# Patient Record
Sex: Female | Born: 1945 | Race: Black or African American | Hispanic: No | Marital: Single | Smoking: Former smoker
Health system: Southern US, Community
[De-identification: ages and names within clinical notes are randomized; demographics above are authoritative.]

## PROBLEM LIST (undated history)

## (undated) DIAGNOSIS — R002 Palpitations: Secondary | ICD-10-CM

## (undated) DIAGNOSIS — M810 Age-related osteoporosis without current pathological fracture: Secondary | ICD-10-CM

## (undated) DIAGNOSIS — R079 Chest pain, unspecified: Secondary | ICD-10-CM

## (undated) DIAGNOSIS — K219 Gastro-esophageal reflux disease without esophagitis: Secondary | ICD-10-CM

## (undated) DIAGNOSIS — R0609 Other forms of dyspnea: Secondary | ICD-10-CM

## (undated) DIAGNOSIS — M1990 Unspecified osteoarthritis, unspecified site: Secondary | ICD-10-CM

## (undated) DIAGNOSIS — E7800 Pure hypercholesterolemia, unspecified: Secondary | ICD-10-CM

## (undated) HISTORY — DX: Palpitations: R00.2

## (undated) HISTORY — DX: Unspecified osteoarthritis, unspecified site: M19.90

## (undated) HISTORY — PX: HERNIA REPAIR: SHX51

## (undated) HISTORY — DX: Gastro-esophageal reflux disease without esophagitis: K21.9

## (undated) HISTORY — DX: Other forms of dyspnea: R06.09

## (undated) HISTORY — DX: Pure hypercholesterolemia, unspecified: E78.00

## (undated) HISTORY — PX: VAGINAL HYSTERECTOMY: SHX2639

## (undated) HISTORY — DX: Age-related osteoporosis without current pathological fracture: M81.0

## (undated) HISTORY — DX: Chest pain, unspecified: R07.9

## (undated) HISTORY — PX: TONSILLECTOMY: SHX5217

---

## 2009-10-19 ENCOUNTER — Encounter: Payer: Self-pay | Admitting: Cardiovascular Disease

## 2009-10-19 HISTORY — PX: CARDIOVASCULAR STRESS TEST: SHX262

## 2009-12-28 ENCOUNTER — Encounter: Admission: RE | Admit: 2009-12-28 | Discharge: 2009-12-28 | Payer: Self-pay | Admitting: Family Medicine

## 2010-01-10 ENCOUNTER — Encounter: Admission: RE | Admit: 2010-01-10 | Discharge: 2010-01-10 | Payer: Self-pay | Admitting: Family Medicine

## 2012-01-17 DIAGNOSIS — E559 Vitamin D deficiency, unspecified: Secondary | ICD-10-CM | POA: Diagnosis not present

## 2012-01-17 DIAGNOSIS — K219 Gastro-esophageal reflux disease without esophagitis: Secondary | ICD-10-CM | POA: Diagnosis not present

## 2012-01-17 DIAGNOSIS — M810 Age-related osteoporosis without current pathological fracture: Secondary | ICD-10-CM | POA: Diagnosis not present

## 2012-01-17 DIAGNOSIS — Z79899 Other long term (current) drug therapy: Secondary | ICD-10-CM | POA: Diagnosis not present

## 2012-01-17 DIAGNOSIS — S83429A Sprain of lateral collateral ligament of unspecified knee, initial encounter: Secondary | ICD-10-CM | POA: Diagnosis not present

## 2012-01-17 DIAGNOSIS — M129 Arthropathy, unspecified: Secondary | ICD-10-CM | POA: Diagnosis not present

## 2012-01-17 DIAGNOSIS — J309 Allergic rhinitis, unspecified: Secondary | ICD-10-CM | POA: Diagnosis not present

## 2012-02-01 DIAGNOSIS — M810 Age-related osteoporosis without current pathological fracture: Secondary | ICD-10-CM | POA: Diagnosis not present

## 2012-02-01 DIAGNOSIS — Z1231 Encounter for screening mammogram for malignant neoplasm of breast: Secondary | ICD-10-CM | POA: Diagnosis not present

## 2012-03-25 DIAGNOSIS — E162 Hypoglycemia, unspecified: Secondary | ICD-10-CM | POA: Diagnosis not present

## 2012-03-25 DIAGNOSIS — S20219A Contusion of unspecified front wall of thorax, initial encounter: Secondary | ICD-10-CM | POA: Diagnosis not present

## 2012-03-25 DIAGNOSIS — R51 Headache: Secondary | ICD-10-CM | POA: Diagnosis not present

## 2012-03-25 DIAGNOSIS — F29 Unspecified psychosis not due to a substance or known physiological condition: Secondary | ICD-10-CM | POA: Diagnosis not present

## 2012-03-26 DIAGNOSIS — E162 Hypoglycemia, unspecified: Secondary | ICD-10-CM | POA: Diagnosis not present

## 2012-03-26 DIAGNOSIS — R51 Headache: Secondary | ICD-10-CM | POA: Diagnosis not present

## 2012-03-26 DIAGNOSIS — R413 Other amnesia: Secondary | ICD-10-CM | POA: Diagnosis not present

## 2012-03-27 DIAGNOSIS — H40009 Preglaucoma, unspecified, unspecified eye: Secondary | ICD-10-CM | POA: Diagnosis not present

## 2012-04-03 DIAGNOSIS — R51 Headache: Secondary | ICD-10-CM | POA: Diagnosis not present

## 2012-04-03 DIAGNOSIS — F29 Unspecified psychosis not due to a substance or known physiological condition: Secondary | ICD-10-CM | POA: Diagnosis not present

## 2012-04-24 DIAGNOSIS — R51 Headache: Secondary | ICD-10-CM | POA: Diagnosis not present

## 2012-04-24 DIAGNOSIS — E162 Hypoglycemia, unspecified: Secondary | ICD-10-CM | POA: Diagnosis not present

## 2012-04-24 DIAGNOSIS — R413 Other amnesia: Secondary | ICD-10-CM | POA: Diagnosis not present

## 2012-04-24 DIAGNOSIS — M129 Arthropathy, unspecified: Secondary | ICD-10-CM | POA: Diagnosis not present

## 2012-06-05 ENCOUNTER — Encounter: Payer: Self-pay | Admitting: *Deleted

## 2012-08-27 DIAGNOSIS — R413 Other amnesia: Secondary | ICD-10-CM | POA: Diagnosis not present

## 2012-08-27 DIAGNOSIS — E162 Hypoglycemia, unspecified: Secondary | ICD-10-CM | POA: Diagnosis not present

## 2012-08-27 DIAGNOSIS — M129 Arthropathy, unspecified: Secondary | ICD-10-CM | POA: Diagnosis not present

## 2012-08-27 DIAGNOSIS — K219 Gastro-esophageal reflux disease without esophagitis: Secondary | ICD-10-CM | POA: Diagnosis not present

## 2012-12-29 DIAGNOSIS — Z23 Encounter for immunization: Secondary | ICD-10-CM | POA: Diagnosis not present

## 2012-12-29 DIAGNOSIS — M129 Arthropathy, unspecified: Secondary | ICD-10-CM | POA: Diagnosis not present

## 2012-12-29 DIAGNOSIS — E7800 Pure hypercholesterolemia, unspecified: Secondary | ICD-10-CM | POA: Diagnosis not present

## 2012-12-29 DIAGNOSIS — J309 Allergic rhinitis, unspecified: Secondary | ICD-10-CM | POA: Diagnosis not present

## 2012-12-29 DIAGNOSIS — E162 Hypoglycemia, unspecified: Secondary | ICD-10-CM | POA: Diagnosis not present

## 2012-12-29 DIAGNOSIS — F0390 Unspecified dementia without behavioral disturbance: Secondary | ICD-10-CM | POA: Diagnosis not present

## 2013-03-11 DIAGNOSIS — Z0000 Encounter for general adult medical examination without abnormal findings: Secondary | ICD-10-CM | POA: Diagnosis not present

## 2013-03-11 DIAGNOSIS — F17200 Nicotine dependence, unspecified, uncomplicated: Secondary | ICD-10-CM | POA: Diagnosis not present

## 2013-03-11 DIAGNOSIS — Z23 Encounter for immunization: Secondary | ICD-10-CM | POA: Diagnosis not present

## 2013-03-11 DIAGNOSIS — Z1331 Encounter for screening for depression: Secondary | ICD-10-CM | POA: Diagnosis not present

## 2013-03-17 DIAGNOSIS — Z1231 Encounter for screening mammogram for malignant neoplasm of breast: Secondary | ICD-10-CM | POA: Diagnosis not present

## 2013-03-20 DIAGNOSIS — Z1212 Encounter for screening for malignant neoplasm of rectum: Secondary | ICD-10-CM | POA: Diagnosis not present

## 2013-05-05 DIAGNOSIS — R42 Dizziness and giddiness: Secondary | ICD-10-CM | POA: Diagnosis not present

## 2013-05-05 DIAGNOSIS — F0390 Unspecified dementia without behavioral disturbance: Secondary | ICD-10-CM | POA: Diagnosis not present

## 2013-05-05 DIAGNOSIS — R002 Palpitations: Secondary | ICD-10-CM | POA: Diagnosis not present

## 2013-05-05 DIAGNOSIS — R079 Chest pain, unspecified: Secondary | ICD-10-CM | POA: Diagnosis not present

## 2013-05-06 ENCOUNTER — Ambulatory Visit (INDEPENDENT_AMBULATORY_CARE_PROVIDER_SITE_OTHER): Payer: Medicare Other | Admitting: Internal Medicine

## 2013-05-06 ENCOUNTER — Encounter: Payer: Self-pay | Admitting: Internal Medicine

## 2013-05-06 VITALS — BP 118/64 | HR 67 | Ht 67.0 in | Wt 129.0 lb

## 2013-05-06 DIAGNOSIS — R0609 Other forms of dyspnea: Secondary | ICD-10-CM | POA: Diagnosis not present

## 2013-05-06 DIAGNOSIS — Z720 Tobacco use: Secondary | ICD-10-CM | POA: Insufficient documentation

## 2013-05-06 DIAGNOSIS — R079 Chest pain, unspecified: Secondary | ICD-10-CM | POA: Diagnosis not present

## 2013-05-06 DIAGNOSIS — F17200 Nicotine dependence, unspecified, uncomplicated: Secondary | ICD-10-CM

## 2013-05-06 DIAGNOSIS — R002 Palpitations: Secondary | ICD-10-CM | POA: Diagnosis not present

## 2013-05-06 DIAGNOSIS — R9431 Abnormal electrocardiogram [ECG] [EKG]: Secondary | ICD-10-CM

## 2013-05-06 DIAGNOSIS — R0989 Other specified symptoms and signs involving the circulatory and respiratory systems: Secondary | ICD-10-CM | POA: Diagnosis not present

## 2013-05-06 DIAGNOSIS — R0600 Dyspnea, unspecified: Secondary | ICD-10-CM

## 2013-05-06 HISTORY — DX: Dyspnea, unspecified: R06.00

## 2013-05-06 HISTORY — DX: Chest pain, unspecified: R07.9

## 2013-05-06 HISTORY — DX: Other forms of dyspnea: R06.09

## 2013-05-06 HISTORY — DX: Palpitations: R00.2

## 2013-05-06 MED ORDER — ASPIRIN EC 81 MG PO TBEC: 81.0000 mg | DELAYED_RELEASE_TABLET | Freq: Every day | ORAL | Status: DC

## 2013-05-06 NOTE — Assessment & Plan Note (Signed)
The patient had prolonged chest pain syndrome associated with shortness of breath. There also accompanying tachypalpitations. Her ECG suggests prior inferior wall MI and she has ST segment elevation in lead V1 and V2. These abnormalities however were somewhat present in 2010 at which time a stress test was apparently unrevealing.  The prolonged nature of her symptoms is concerning for cardiac event not withstanding. She is largely asymptomatic over the last 72 hours. Hence I think we have a couple of options. One is cardiac catheterization with inpatient evaluation, the second would be expeditious Myoview scanning with event recording looking for the cause of palpitations. She would prefer the latter.  Another possible explanation would be that this is a primary arrhythmia. There is variability in heart rate associated with exertion and with her age atrial fibrillation would be a strong contender not withstanding the fact that she describes the heart rate as  regular
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
## 2013-05-06 NOTE — Patient Instructions (Addendum)
Your physician recommends that you schedule a follow-up appointment as needed   Your physician has requested that you have a lexiscan myoview. For further information please visit [email or web address] Please follow instruction sheet, as given.

## 2013-05-06 NOTE — Assessment & Plan Note (Signed)
As above.

## 2013-05-06 NOTE — Assessment & Plan Note (Signed)
Bi atrial enlargement noted  Will need echo to assess structure and function.  Will try and coordinate tomorrow when she comes for her sterss test

## 2013-05-06 NOTE — Assessment & Plan Note (Signed)
She says she is for of smoking as of 4 days ago. HOORAY

## 2013-05-07 ENCOUNTER — Ambulatory Visit (HOSPITAL_COMMUNITY): Payer: Medicare Other | Attending: Cardiology | Admitting: Radiology

## 2013-05-07 VITALS — BP 132/98 | HR 56 | Ht 67.0 in | Wt 130.0 lb

## 2013-05-07 DIAGNOSIS — R002 Palpitations: Secondary | ICD-10-CM | POA: Insufficient documentation

## 2013-05-07 DIAGNOSIS — R42 Dizziness and giddiness: Secondary | ICD-10-CM | POA: Insufficient documentation

## 2013-05-07 DIAGNOSIS — E785 Hyperlipidemia, unspecified: Secondary | ICD-10-CM | POA: Diagnosis not present

## 2013-05-07 DIAGNOSIS — Z8673 Personal history of transient ischemic attack (TIA), and cerebral infarction without residual deficits: Secondary | ICD-10-CM | POA: Diagnosis not present

## 2013-05-07 DIAGNOSIS — Z87891 Personal history of nicotine dependence: Secondary | ICD-10-CM | POA: Diagnosis not present

## 2013-05-07 DIAGNOSIS — R0789 Other chest pain: Secondary | ICD-10-CM | POA: Insufficient documentation

## 2013-05-07 DIAGNOSIS — R0602 Shortness of breath: Secondary | ICD-10-CM | POA: Diagnosis not present

## 2013-05-07 DIAGNOSIS — R000 Tachycardia, unspecified: Secondary | ICD-10-CM | POA: Diagnosis not present

## 2013-05-07 DIAGNOSIS — R079 Chest pain, unspecified: Secondary | ICD-10-CM

## 2013-05-07 DIAGNOSIS — R5381 Other malaise: Secondary | ICD-10-CM | POA: Diagnosis not present

## 2013-05-07 DIAGNOSIS — R5383 Other fatigue: Secondary | ICD-10-CM | POA: Insufficient documentation

## 2013-05-07 DIAGNOSIS — I10 Essential (primary) hypertension: Secondary | ICD-10-CM | POA: Insufficient documentation

## 2013-05-07 DIAGNOSIS — R0989 Other specified symptoms and signs involving the circulatory and respiratory systems: Secondary | ICD-10-CM | POA: Insufficient documentation

## 2013-05-07 DIAGNOSIS — R0609 Other forms of dyspnea: Secondary | ICD-10-CM | POA: Insufficient documentation

## 2013-05-07 MED ORDER — TECHNETIUM TC 99M SESTAMIBI GENERIC - CARDIOLITE
30.0000 | Freq: Once | INTRAVENOUS | Status: AC | PRN
  Administered 2013-05-07: 30 via INTRAVENOUS

## 2013-05-07 MED ORDER — TECHNETIUM TC 99M SESTAMIBI GENERIC - CARDIOLITE
10.0000 | Freq: Once | INTRAVENOUS | Status: AC | PRN
  Administered 2013-05-07: 10 via INTRAVENOUS

## 2013-05-07 MED ORDER — REGADENOSON 0.4 MG/5ML IV SOLN
0.4000 mg | Freq: Once | INTRAVENOUS | Status: AC
  Administered 2013-05-07: 0.4 mg via INTRAVENOUS

## 2013-05-07 NOTE — Progress Notes (Signed)
[facility] SITE 3 NUCLEAR MED [address] [place], [place] [address] [phone]    Cardiology Nuclear Med Study  Joy Higgins is a 67 y.o. female     MRN : [id number]     DOB: 1946/12/11  Procedure Date: 05/07/2013  Nuclear Med Background Indication for Stress Test:  Evaluation for Ischemia History:  '10 MPS:no ischemia, EF=82% Cardiac Risk Factors: Hypertension, Lipids, Smoker-Recently Quit and TIA  Symptoms:  Chest Pressure with Exertion (last episode of chest discomfort was about a week ago), Dizziness, DOE, Fatigue, Light-Headedness, Palpitations and Rapid HR   Nuclear Pre-Procedure Caffeine/Decaff Intake:  None NPO After: 7:00pm   Lungs:  clear O2 Sat: 96% on room air. IV 0.9% NS with Angio Cath:  22g  IV Site: R Antecubital  IV Started by:  Bonnita Levan, RN  Chest Size (in):  34 Cup Size: A  Height: 5\' 7"  (1.702 m)  Weight:  130 lb (58.968 kg)  BMI:  Body mass index is 20.36 kg/(m^2). Tech Comments:  Toprol held x 24 hours    Nuclear Med Study 1 or 2 day study: 1 day  Stress Test Type:  Lexiscan  Reading MD: Willa Rough, MD  Order Authorizing Provider:  Berton Mount, MD  Resting Radionuclide: Technetium 1m Sestamibi  Resting Radionuclide Dose: 11.0 mCi   Stress Radionuclide:  Technetium 21m Sestamibi  Stress Radionuclide Dose: 32.9 mCi           Stress Protocol Rest HR: 56 Stress HR: 87  Rest BP: 132/98 Stress BP: 138/92  Exercise Time (min): n/a METS: n/a   Predicted Max HR: 152 bpm % Max HR: 57.24 bpm Rate Pressure Product: 21308   Dose of Adenosine (mg):  n/a Dose of Lexiscan: 0.4 mg  Dose of Atropine (mg): n/a Dose of Dobutamine: n/a mcg/kg/min (at max HR)  Stress Test Technologist: Smiley Houseman, CMA-N  Nuclear Technologist:  Domenic Polite, CNMT     Rest Procedure:  Myocardial perfusion imaging was performed at rest 45 minutes following the intravenous administration of Technetium 5m Sestamibi.  Rest ECG: Normal sinus rhythm.  Decreased anterior R wave progression.  Stress Procedure:  The patient received IV Lexiscan 0.4 mg over 15-seconds.  Technetium 12m Sestamibi injected at 30-seconds.  She did c/o chest pressure with Lexiscan.  Quantitative spect images were obtained after a 45 minute delay.  Stress ECG: No significant change from baseline ECG  QPS Raw Data Images:  Patient motion noted; appropriate software correction applied. Stress Images:  Normal homogeneous uptake in all areas of the myocardium. Rest Images:  Normal homogeneous uptake in all areas of the myocardium. Subtraction (SDS):  No evidence of ischemia. Transient Ischemic Dilatation (Normal <1.22):  1.00 Lung/Heart Ratio (Normal <0.45):  0.35  Quantitative Gated Spect Images QGS EDV:  57 ml QGS ESV:  17 ml  Impression Exercise Capacity:  Lexiscan with no exercise. BP Response:  Normal blood pressure response. Clinical Symptoms:  chest pressure ECG Impression:  No significant ST segment change suggestive of ischemia. Comparison with Prior Nuclear Study: No significant change from previous study Of October, 2010.  Overall Impression:  Normal stress nuclear study. There is no scar or ischemia. There is normal wall motion. This is a low risk scan. There is no change from the prior study.  LV Ejection Fraction: 71%.  LV Wall Motion:  Normal Wall Motion.  Willa Rough, MD

## 2013-05-08 NOTE — Addendum Note (Signed)
Addended by: Milinda Antis on: 05/08/2013 08:06 AM   Modules accepted: Orders

## 2013-05-21 ENCOUNTER — Encounter: Payer: Self-pay | Admitting: Internal Medicine

## 2013-08-05 DIAGNOSIS — R1013 Epigastric pain: Secondary | ICD-10-CM | POA: Diagnosis not present

## 2013-08-05 DIAGNOSIS — R42 Dizziness and giddiness: Secondary | ICD-10-CM | POA: Diagnosis not present

## 2013-08-19 DIAGNOSIS — S43429A Sprain of unspecified rotator cuff capsule, initial encounter: Secondary | ICD-10-CM | POA: Diagnosis not present

## 2013-08-19 DIAGNOSIS — S0003XA Contusion of scalp, initial encounter: Secondary | ICD-10-CM | POA: Diagnosis not present

## 2013-08-27 DIAGNOSIS — S43429A Sprain of unspecified rotator cuff capsule, initial encounter: Secondary | ICD-10-CM | POA: Diagnosis not present

## 2014-02-22 DIAGNOSIS — M129 Arthropathy, unspecified: Secondary | ICD-10-CM | POA: Diagnosis not present

## 2014-02-22 DIAGNOSIS — I10 Essential (primary) hypertension: Secondary | ICD-10-CM | POA: Diagnosis not present

## 2014-02-22 DIAGNOSIS — F0390 Unspecified dementia without behavioral disturbance: Secondary | ICD-10-CM | POA: Diagnosis not present

## 2014-02-22 DIAGNOSIS — M810 Age-related osteoporosis without current pathological fracture: Secondary | ICD-10-CM | POA: Diagnosis not present

## 2014-08-25 DIAGNOSIS — E7800 Pure hypercholesterolemia, unspecified: Secondary | ICD-10-CM | POA: Diagnosis not present

## 2014-08-25 DIAGNOSIS — Z1239 Encounter for other screening for malignant neoplasm of breast: Secondary | ICD-10-CM | POA: Diagnosis not present

## 2014-08-25 DIAGNOSIS — Z9181 History of falling: Secondary | ICD-10-CM | POA: Diagnosis not present

## 2014-08-25 DIAGNOSIS — I10 Essential (primary) hypertension: Secondary | ICD-10-CM | POA: Diagnosis not present

## 2014-08-25 DIAGNOSIS — J309 Allergic rhinitis, unspecified: Secondary | ICD-10-CM | POA: Diagnosis not present

## 2014-08-25 DIAGNOSIS — Z1331 Encounter for screening for depression: Secondary | ICD-10-CM | POA: Diagnosis not present

## 2014-08-25 DIAGNOSIS — M810 Age-related osteoporosis without current pathological fracture: Secondary | ICD-10-CM | POA: Diagnosis not present

## 2014-09-29 DIAGNOSIS — Z1231 Encounter for screening mammogram for malignant neoplasm of breast: Secondary | ICD-10-CM | POA: Diagnosis not present

## 2014-09-29 DIAGNOSIS — Z780 Asymptomatic menopausal state: Secondary | ICD-10-CM | POA: Diagnosis not present

## 2014-09-29 DIAGNOSIS — M810 Age-related osteoporosis without current pathological fracture: Secondary | ICD-10-CM | POA: Diagnosis not present

## 2014-10-29 DIAGNOSIS — Z23 Encounter for immunization: Secondary | ICD-10-CM | POA: Diagnosis not present

## 2014-11-04 DIAGNOSIS — H2513 Age-related nuclear cataract, bilateral: Secondary | ICD-10-CM | POA: Diagnosis not present

## 2014-12-07 DIAGNOSIS — H40009 Preglaucoma, unspecified, unspecified eye: Secondary | ICD-10-CM | POA: Diagnosis not present

## 2015-01-05 ENCOUNTER — Ambulatory Visit: Payer: Self-pay | Admitting: Ophthalmology

## 2015-01-05 DIAGNOSIS — H269 Unspecified cataract: Secondary | ICD-10-CM | POA: Diagnosis not present

## 2015-01-05 DIAGNOSIS — H2513 Age-related nuclear cataract, bilateral: Secondary | ICD-10-CM | POA: Diagnosis not present

## 2015-01-05 DIAGNOSIS — Z01810 Encounter for preprocedural cardiovascular examination: Secondary | ICD-10-CM | POA: Diagnosis not present

## 2015-01-24 ENCOUNTER — Ambulatory Visit: Payer: Self-pay | Admitting: Ophthalmology

## 2015-01-24 DIAGNOSIS — M810 Age-related osteoporosis without current pathological fracture: Secondary | ICD-10-CM | POA: Diagnosis not present

## 2015-01-24 DIAGNOSIS — E119 Type 2 diabetes mellitus without complications: Secondary | ICD-10-CM | POA: Diagnosis not present

## 2015-01-24 DIAGNOSIS — Z720 Tobacco use: Secondary | ICD-10-CM | POA: Diagnosis not present

## 2015-01-24 DIAGNOSIS — M1990 Unspecified osteoarthritis, unspecified site: Secondary | ICD-10-CM | POA: Diagnosis not present

## 2015-01-24 DIAGNOSIS — I10 Essential (primary) hypertension: Secondary | ICD-10-CM | POA: Diagnosis not present

## 2015-01-24 DIAGNOSIS — H2513 Age-related nuclear cataract, bilateral: Secondary | ICD-10-CM | POA: Diagnosis not present

## 2015-01-24 DIAGNOSIS — F329 Major depressive disorder, single episode, unspecified: Secondary | ICD-10-CM | POA: Diagnosis not present

## 2015-01-24 DIAGNOSIS — H2511 Age-related nuclear cataract, right eye: Secondary | ICD-10-CM | POA: Diagnosis not present

## 2015-01-24 DIAGNOSIS — Z791 Long term (current) use of non-steroidal anti-inflammatories (NSAID): Secondary | ICD-10-CM | POA: Diagnosis not present

## 2015-01-24 DIAGNOSIS — Z79899 Other long term (current) drug therapy: Secondary | ICD-10-CM | POA: Diagnosis not present

## 2015-02-08 DIAGNOSIS — H2511 Age-related nuclear cataract, right eye: Secondary | ICD-10-CM | POA: Diagnosis not present

## 2015-02-14 ENCOUNTER — Ambulatory Visit: Payer: Self-pay | Admitting: Ophthalmology

## 2015-02-14 DIAGNOSIS — I10 Essential (primary) hypertension: Secondary | ICD-10-CM | POA: Diagnosis not present

## 2015-02-14 DIAGNOSIS — F329 Major depressive disorder, single episode, unspecified: Secondary | ICD-10-CM | POA: Diagnosis not present

## 2015-02-14 DIAGNOSIS — J449 Chronic obstructive pulmonary disease, unspecified: Secondary | ICD-10-CM | POA: Diagnosis not present

## 2015-02-14 DIAGNOSIS — M810 Age-related osteoporosis without current pathological fracture: Secondary | ICD-10-CM | POA: Diagnosis not present

## 2015-02-14 DIAGNOSIS — M1990 Unspecified osteoarthritis, unspecified site: Secondary | ICD-10-CM | POA: Diagnosis not present

## 2015-02-14 DIAGNOSIS — Z791 Long term (current) use of non-steroidal anti-inflammatories (NSAID): Secondary | ICD-10-CM | POA: Diagnosis not present

## 2015-02-14 DIAGNOSIS — H269 Unspecified cataract: Secondary | ICD-10-CM | POA: Diagnosis not present

## 2015-02-14 DIAGNOSIS — E119 Type 2 diabetes mellitus without complications: Secondary | ICD-10-CM | POA: Diagnosis not present

## 2015-02-14 DIAGNOSIS — H2511 Age-related nuclear cataract, right eye: Secondary | ICD-10-CM | POA: Diagnosis not present

## 2015-02-28 DIAGNOSIS — F0390 Unspecified dementia without behavioral disturbance: Secondary | ICD-10-CM | POA: Diagnosis not present

## 2015-02-28 DIAGNOSIS — E780 Pure hypercholesterolemia: Secondary | ICD-10-CM | POA: Diagnosis not present

## 2015-02-28 DIAGNOSIS — M129 Arthropathy, unspecified: Secondary | ICD-10-CM | POA: Diagnosis not present

## 2015-02-28 DIAGNOSIS — M810 Age-related osteoporosis without current pathological fracture: Secondary | ICD-10-CM | POA: Diagnosis not present

## 2015-02-28 DIAGNOSIS — I10 Essential (primary) hypertension: Secondary | ICD-10-CM | POA: Diagnosis not present

## 2015-02-28 DIAGNOSIS — J309 Allergic rhinitis, unspecified: Secondary | ICD-10-CM | POA: Diagnosis not present

## 2015-02-28 DIAGNOSIS — R5383 Other fatigue: Secondary | ICD-10-CM | POA: Diagnosis not present

## 2015-04-24 NOTE — Op Note (Signed)
PATIENT NAME:  Joy Higgins, Joy Higgins MR#:  [id number] DATE OF BIRTH:  November 06, 1946  DATE OF PROCEDURE:  01/24/2015  PREOPERATIVE DIAGNOSIS: Cataract, right eye.   POSTOPERATIVE DIAGNOSIS: Cataract, right eye.  PROCEDURE PERFORMED: Extracapsular cataract extraction using phacoemulsification with placement Alcon SN6CWS, 21.5 diopter posterior chamber lens, serial number [id number].  ANESTHESIA: 4% lidocaine and 0.75% Marcaine in a 50/50 given as a peribulbar.   ANESTHESIOLOGIST: Oleh Geninarrie M. Polin, MD   COMPLICATIONS: None.   ESTIMATED BLOOD LOSS: Less than 1 mL.   DESCRIPTION OF PROCEDURE:  The patient was brought to the operating room and given a peribulbar block.  The patient was then prepped and draped in the usual fashion.  The vertical rectus muscles were imbricated using 5-0 silk sutures.  These sutures were then clamped to the sterile drapes as bridle sutures.  A limbal peritomy was performed extending two clock hours and hemostasis was obtained with cautery.  A partial thickness scleral groove was made at the surgical limbus and dissected anteriorly in a lamellar dissection using an Alcon crescent knife.  The anterior chamber was entered superonasally with a Superblade and through the lamellar dissection with a 2.6 mm keratome.  DisCoVisc was used to replace the aqueous and a continuous tear capsulorrhexis was carried out.  Hydrodissection and hydrodelineation were carried out with balanced salt and a 27 gauge canula.  The nucleus was rotated to confirm the effectiveness of the hydrodissection.  Phacoemulsification was carried out using a divide-and-conquer technique.  Total ultrasound time was 2 minutes and 12 seconds with an average power of 27 percent.  Irrigation/aspiration was used to remove the residual cortex.  DisCoVisc was used to inflate the capsule and the internal incision was enlarged to 3 mm with the crescent knife.  The intraocular lens was folded and inserted into the capsular bag  using the Alcon AcrySert delivery system.  Irrigation/aspiration was used to remove the residual DisCoVisc.  Miostat was injected into the anterior chamber through the paracentesis track to inflate the anterior chamber and induce miosis.  The wound was checked for leaks and none were found. The conjunctiva was closed with cautery and the bridle sutures were removed.  Two drops of 0.3% Vigamox were placed on the eye.   An eye shield was placed on the eye.   0.10 mL of cefuroxime containing 1 mg of drug was injected via the paracentesis track.  The patient was discharged to the recovery room in good condition.  ____________________________ Maylon PeppersSteven A. Hideko Esselman, MD sad:ap D: 01/24/2015 11:28:57 ET T: 01/24/2015 11:54:05 ET JOB#: [id number]  cc: Viviann SpareSteven A. Jewett Mcgann, MD, <Dictator> Erline LevineSTEVEN A Audree Schrecengost MD ELECTRONICALLY SIGNED 01/31/2015 10:36

## 2015-04-24 NOTE — Op Note (Signed)
PATIENT NAME:  Joy Higgins, Joy Higgins MR#:  [id number] DATE OF BIRTH:  1946-10-14  DATE OF PROCEDURE:  02/14/2015  PREOPERATIVE DIAGNOSIS:  Cataract, left eye.    POSTOPERATIVE DIAGNOSIS:  Cataract, left eye.  PROCEDURE PERFORMED:  Extracapsular cataract extraction using phacoemulsification with placement of an Alcon SN6CWS, 21.5-diopter posterior chamber lens, serial #[id number].  SURGEON:  Maylon PeppersSteven A. Jeree Delcid, MD  ASSISTANT:  None.  ANESTHESIA:  4% lidocaine and 0.75% Marcaine in a 50/50 mixture with 10 units/mL of Hylenex added, given as a peribulbar.   ANESTHESIOLOGIST:  Randall AnGjibertus Van Staveren, MD  COMPLICATIONS:  None.  ESTIMATED BLOOD LOSS:  Less than 1 ml.  DESCRIPTION OF PROCEDURE:  The patient was brought to the operating room and given a peribulbar block.  The patient was then prepped and draped in the usual fashion.  The vertical rectus muscles were imbricated using 5-0 silk sutures.  These sutures were then clamped to the sterile drapes as bridle sutures.  A limbal peritomy was performed extending two clock hours and hemostasis was obtained with cautery.  A partial thickness scleral groove was made at the surgical limbus and dissected anteriorly in a lamellar dissection using an Alcon crescent knife.  The anterior chamber was entered supero-temporally with a Superblade and through the lamellar dissection with a 2.6 mm keratome.  DisCoVisc was used to replace the aqueous and a continuous tear capsulorrhexis was carried out.  Hydrodissection and hydrodelineation were carried out with balanced salt and a 27 gauge canula.  The nucleus was rotated to confirm the effectiveness of the hydrodissection.  Phacoemulsification was carried out using a divide-and-conquer technique.  Total ultrasound time was 2 minutes and 12 seconds with an average power of 24.9 percent and CDE of 53.79.  Irrigation/aspiration was used to remove the residual cortex.  DisCoVisc was used to inflate the capsule and  the internal incision was enlarged to 3 mm with the crescent knife.  The intraocular lens was folded and inserted into the capsular bag using the AcrySert delivery system. Irrigation/aspiration was used to remove the residual DisCoVisc.  Miostat was injected into the anterior chamber through the paracentesis track to inflate the anterior chamber and induce miosis.  A tenth of a milliliter of cefuroxime containing 1 mg of drug was injected via the paracentesis tract. The wound was checked for leaks and none were found. The conjunctiva was closed with cautery and the bridle sutures were removed.  Two drops of 0.3% Vigamox were placed on the eye.   An eye shield was placed on the eye.  The patient was discharged to the recovery room in good condition.   ____________________________ Maylon PeppersSteven A. Ramere Downs, MD sad:sb D: 02/14/2015 13:18:53 ET T: 02/14/2015 13:26:11 ET JOB#: [id number]  cc: Viviann SpareSteven A. Courteny Egler, MD, <Dictator> Erline LevineSTEVEN A Karalina Tift MD ELECTRONICALLY SIGNED 02/14/2015 13:51

## 2015-10-21 DIAGNOSIS — Z961 Presence of intraocular lens: Secondary | ICD-10-CM | POA: Diagnosis not present

## 2016-01-04 DIAGNOSIS — Z1389 Encounter for screening for other disorder: Secondary | ICD-10-CM | POA: Diagnosis not present

## 2016-01-04 DIAGNOSIS — E7800 Pure hypercholesterolemia, unspecified: Secondary | ICD-10-CM | POA: Diagnosis not present

## 2016-01-04 DIAGNOSIS — F0390 Unspecified dementia without behavioral disturbance: Secondary | ICD-10-CM | POA: Diagnosis not present

## 2016-01-04 DIAGNOSIS — Z0000 Encounter for general adult medical examination without abnormal findings: Secondary | ICD-10-CM | POA: Diagnosis not present

## 2016-01-04 DIAGNOSIS — Z23 Encounter for immunization: Secondary | ICD-10-CM | POA: Diagnosis not present

## 2016-01-04 DIAGNOSIS — M810 Age-related osteoporosis without current pathological fracture: Secondary | ICD-10-CM | POA: Diagnosis not present

## 2016-01-04 DIAGNOSIS — Z1231 Encounter for screening mammogram for malignant neoplasm of breast: Secondary | ICD-10-CM | POA: Diagnosis not present

## 2016-01-04 DIAGNOSIS — R1032 Left lower quadrant pain: Secondary | ICD-10-CM | POA: Diagnosis not present

## 2016-01-04 DIAGNOSIS — R0989 Other specified symptoms and signs involving the circulatory and respiratory systems: Secondary | ICD-10-CM | POA: Diagnosis not present

## 2016-01-04 DIAGNOSIS — E559 Vitamin D deficiency, unspecified: Secondary | ICD-10-CM | POA: Diagnosis not present

## 2016-01-04 DIAGNOSIS — N390 Urinary tract infection, site not specified: Secondary | ICD-10-CM | POA: Diagnosis not present

## 2016-01-04 DIAGNOSIS — Z9181 History of falling: Secondary | ICD-10-CM | POA: Diagnosis not present

## 2016-01-04 DIAGNOSIS — I10 Essential (primary) hypertension: Secondary | ICD-10-CM | POA: Diagnosis not present

## 2016-01-10 DIAGNOSIS — Z1231 Encounter for screening mammogram for malignant neoplasm of breast: Secondary | ICD-10-CM | POA: Diagnosis not present

## 2016-02-06 DIAGNOSIS — F329 Major depressive disorder, single episode, unspecified: Secondary | ICD-10-CM | POA: Diagnosis not present

## 2016-02-06 DIAGNOSIS — Z1389 Encounter for screening for other disorder: Secondary | ICD-10-CM | POA: Diagnosis not present

## 2016-02-06 DIAGNOSIS — M810 Age-related osteoporosis without current pathological fracture: Secondary | ICD-10-CM | POA: Diagnosis not present

## 2016-02-06 DIAGNOSIS — R1032 Left lower quadrant pain: Secondary | ICD-10-CM | POA: Diagnosis not present

## 2016-02-06 DIAGNOSIS — R1310 Dysphagia, unspecified: Secondary | ICD-10-CM | POA: Diagnosis not present

## 2016-02-06 DIAGNOSIS — K219 Gastro-esophageal reflux disease without esophagitis: Secondary | ICD-10-CM | POA: Diagnosis not present

## 2016-02-06 DIAGNOSIS — R6889 Other general symptoms and signs: Secondary | ICD-10-CM | POA: Diagnosis not present

## 2016-02-06 DIAGNOSIS — G4700 Insomnia, unspecified: Secondary | ICD-10-CM | POA: Diagnosis not present

## 2016-02-06 DIAGNOSIS — F17200 Nicotine dependence, unspecified, uncomplicated: Secondary | ICD-10-CM | POA: Diagnosis not present

## 2016-02-10 DIAGNOSIS — R1032 Left lower quadrant pain: Secondary | ICD-10-CM | POA: Diagnosis not present

## 2016-03-05 DIAGNOSIS — R634 Abnormal weight loss: Secondary | ICD-10-CM | POA: Diagnosis not present

## 2016-03-05 DIAGNOSIS — M810 Age-related osteoporosis without current pathological fracture: Secondary | ICD-10-CM | POA: Diagnosis not present

## 2016-03-05 DIAGNOSIS — R1032 Left lower quadrant pain: Secondary | ICD-10-CM | POA: Diagnosis not present

## 2016-03-05 DIAGNOSIS — R1310 Dysphagia, unspecified: Secondary | ICD-10-CM | POA: Diagnosis not present

## 2016-03-05 DIAGNOSIS — Z681 Body mass index (BMI) 19 or less, adult: Secondary | ICD-10-CM | POA: Diagnosis not present

## 2016-07-12 DIAGNOSIS — G4700 Insomnia, unspecified: Secondary | ICD-10-CM | POA: Diagnosis not present

## 2016-07-12 DIAGNOSIS — K219 Gastro-esophageal reflux disease without esophagitis: Secondary | ICD-10-CM | POA: Diagnosis not present

## 2016-07-12 DIAGNOSIS — M810 Age-related osteoporosis without current pathological fracture: Secondary | ICD-10-CM | POA: Diagnosis not present

## 2016-07-12 DIAGNOSIS — E559 Vitamin D deficiency, unspecified: Secondary | ICD-10-CM | POA: Diagnosis not present

## 2016-07-12 DIAGNOSIS — I10 Essential (primary) hypertension: Secondary | ICD-10-CM | POA: Diagnosis not present

## 2016-07-12 DIAGNOSIS — F0390 Unspecified dementia without behavioral disturbance: Secondary | ICD-10-CM | POA: Diagnosis not present

## 2016-07-12 DIAGNOSIS — M129 Arthropathy, unspecified: Secondary | ICD-10-CM | POA: Diagnosis not present

## 2016-07-12 DIAGNOSIS — Z681 Body mass index (BMI) 19 or less, adult: Secondary | ICD-10-CM | POA: Diagnosis not present

## 2016-07-12 DIAGNOSIS — F17200 Nicotine dependence, unspecified, uncomplicated: Secondary | ICD-10-CM | POA: Diagnosis not present

## 2016-12-31 DIAGNOSIS — Z720 Tobacco use: Secondary | ICD-10-CM | POA: Diagnosis not present

## 2016-12-31 DIAGNOSIS — Z1231 Encounter for screening mammogram for malignant neoplasm of breast: Secondary | ICD-10-CM | POA: Diagnosis not present

## 2016-12-31 DIAGNOSIS — I10 Essential (primary) hypertension: Secondary | ICD-10-CM | POA: Diagnosis not present

## 2016-12-31 DIAGNOSIS — E559 Vitamin D deficiency, unspecified: Secondary | ICD-10-CM | POA: Diagnosis not present

## 2016-12-31 DIAGNOSIS — M810 Age-related osteoporosis without current pathological fracture: Secondary | ICD-10-CM | POA: Diagnosis not present

## 2016-12-31 DIAGNOSIS — E7800 Pure hypercholesterolemia, unspecified: Secondary | ICD-10-CM | POA: Diagnosis not present

## 2016-12-31 DIAGNOSIS — Z23 Encounter for immunization: Secondary | ICD-10-CM | POA: Diagnosis not present

## 2016-12-31 DIAGNOSIS — F0390 Unspecified dementia without behavioral disturbance: Secondary | ICD-10-CM | POA: Diagnosis not present

## 2016-12-31 DIAGNOSIS — Z1211 Encounter for screening for malignant neoplasm of colon: Secondary | ICD-10-CM | POA: Diagnosis not present

## 2016-12-31 DIAGNOSIS — Z0000 Encounter for general adult medical examination without abnormal findings: Secondary | ICD-10-CM | POA: Diagnosis not present

## 2016-12-31 DIAGNOSIS — K219 Gastro-esophageal reflux disease without esophagitis: Secondary | ICD-10-CM | POA: Diagnosis not present

## 2016-12-31 DIAGNOSIS — Z6820 Body mass index (BMI) 20.0-20.9, adult: Secondary | ICD-10-CM | POA: Diagnosis not present

## 2017-01-20 DIAGNOSIS — I10 Essential (primary) hypertension: Secondary | ICD-10-CM | POA: Diagnosis not present

## 2017-01-20 DIAGNOSIS — F0390 Unspecified dementia without behavioral disturbance: Secondary | ICD-10-CM | POA: Diagnosis not present

## 2017-01-20 DIAGNOSIS — N390 Urinary tract infection, site not specified: Secondary | ICD-10-CM | POA: Diagnosis not present

## 2017-01-20 DIAGNOSIS — J189 Pneumonia, unspecified organism: Secondary | ICD-10-CM | POA: Diagnosis not present

## 2017-01-20 DIAGNOSIS — S2239XA Fracture of one rib, unspecified side, initial encounter for closed fracture: Secondary | ICD-10-CM | POA: Diagnosis not present

## 2017-01-20 DIAGNOSIS — S2232XA Fracture of one rib, left side, initial encounter for closed fracture: Secondary | ICD-10-CM | POA: Diagnosis not present

## 2017-01-20 DIAGNOSIS — J9819 Other pulmonary collapse: Secondary | ICD-10-CM | POA: Diagnosis not present

## 2017-01-20 DIAGNOSIS — Z79899 Other long term (current) drug therapy: Secondary | ICD-10-CM | POA: Diagnosis not present

## 2017-01-20 DIAGNOSIS — S3991XA Unspecified injury of abdomen, initial encounter: Secondary | ICD-10-CM | POA: Diagnosis not present

## 2017-01-20 DIAGNOSIS — F17210 Nicotine dependence, cigarettes, uncomplicated: Secondary | ICD-10-CM | POA: Diagnosis not present

## 2017-01-20 DIAGNOSIS — R0902 Hypoxemia: Secondary | ICD-10-CM | POA: Diagnosis not present

## 2017-01-20 DIAGNOSIS — R918 Other nonspecific abnormal finding of lung field: Secondary | ICD-10-CM | POA: Diagnosis not present

## 2017-01-21 DIAGNOSIS — N390 Urinary tract infection, site not specified: Secondary | ICD-10-CM | POA: Diagnosis not present

## 2017-01-21 DIAGNOSIS — S2239XA Fracture of one rib, unspecified side, initial encounter for closed fracture: Secondary | ICD-10-CM | POA: Diagnosis not present

## 2017-01-21 DIAGNOSIS — R0902 Hypoxemia: Secondary | ICD-10-CM | POA: Diagnosis not present

## 2017-01-24 DIAGNOSIS — J90 Pleural effusion, not elsewhere classified: Secondary | ICD-10-CM | POA: Diagnosis not present

## 2017-01-25 DIAGNOSIS — J9811 Atelectasis: Secondary | ICD-10-CM | POA: Diagnosis not present

## 2017-01-25 DIAGNOSIS — S2232XA Fracture of one rib, left side, initial encounter for closed fracture: Secondary | ICD-10-CM | POA: Diagnosis not present

## 2017-01-25 DIAGNOSIS — Z79899 Other long term (current) drug therapy: Secondary | ICD-10-CM | POA: Diagnosis not present

## 2017-01-25 DIAGNOSIS — N390 Urinary tract infection, site not specified: Secondary | ICD-10-CM | POA: Diagnosis not present

## 2017-01-25 DIAGNOSIS — J189 Pneumonia, unspecified organism: Secondary | ICD-10-CM | POA: Diagnosis not present

## 2017-01-25 DIAGNOSIS — J9600 Acute respiratory failure, unspecified whether with hypoxia or hypercapnia: Secondary | ICD-10-CM | POA: Diagnosis not present

## 2017-02-05 DIAGNOSIS — R918 Other nonspecific abnormal finding of lung field: Secondary | ICD-10-CM | POA: Diagnosis not present

## 2017-02-19 DIAGNOSIS — I700 Atherosclerosis of aorta: Secondary | ICD-10-CM | POA: Diagnosis not present

## 2017-02-19 DIAGNOSIS — R590 Localized enlarged lymph nodes: Secondary | ICD-10-CM | POA: Diagnosis not present

## 2017-02-19 DIAGNOSIS — I2510 Atherosclerotic heart disease of native coronary artery without angina pectoris: Secondary | ICD-10-CM | POA: Diagnosis not present

## 2017-02-19 DIAGNOSIS — R918 Other nonspecific abnormal finding of lung field: Secondary | ICD-10-CM | POA: Diagnosis not present

## 2017-02-22 DIAGNOSIS — R197 Diarrhea, unspecified: Secondary | ICD-10-CM | POA: Diagnosis not present

## 2017-02-22 DIAGNOSIS — S2232XA Fracture of one rib, left side, initial encounter for closed fracture: Secondary | ICD-10-CM | POA: Diagnosis not present

## 2017-02-22 DIAGNOSIS — J189 Pneumonia, unspecified organism: Secondary | ICD-10-CM | POA: Diagnosis not present

## 2017-02-22 DIAGNOSIS — F329 Major depressive disorder, single episode, unspecified: Secondary | ICD-10-CM | POA: Diagnosis not present

## 2017-02-22 DIAGNOSIS — R630 Anorexia: Secondary | ICD-10-CM | POA: Diagnosis not present

## 2017-02-22 DIAGNOSIS — Z9181 History of falling: Secondary | ICD-10-CM | POA: Diagnosis not present

## 2017-02-22 DIAGNOSIS — Z1389 Encounter for screening for other disorder: Secondary | ICD-10-CM | POA: Diagnosis not present

## 2017-02-25 DIAGNOSIS — R197 Diarrhea, unspecified: Secondary | ICD-10-CM | POA: Diagnosis not present

## 2017-02-27 DIAGNOSIS — R918 Other nonspecific abnormal finding of lung field: Secondary | ICD-10-CM | POA: Diagnosis not present

## 2017-02-27 DIAGNOSIS — J301 Allergic rhinitis due to pollen: Secondary | ICD-10-CM | POA: Diagnosis not present

## 2017-02-27 DIAGNOSIS — J449 Chronic obstructive pulmonary disease, unspecified: Secondary | ICD-10-CM | POA: Diagnosis not present

## 2017-02-27 DIAGNOSIS — R590 Localized enlarged lymph nodes: Secondary | ICD-10-CM | POA: Diagnosis not present

## 2017-02-27 DIAGNOSIS — F17210 Nicotine dependence, cigarettes, uncomplicated: Secondary | ICD-10-CM | POA: Diagnosis not present

## 2017-03-11 DIAGNOSIS — J301 Allergic rhinitis due to pollen: Secondary | ICD-10-CM | POA: Diagnosis not present

## 2017-04-04 DIAGNOSIS — R197 Diarrhea, unspecified: Secondary | ICD-10-CM | POA: Diagnosis not present

## 2017-05-29 DIAGNOSIS — M25561 Pain in right knee: Secondary | ICD-10-CM | POA: Diagnosis not present

## 2017-05-29 DIAGNOSIS — I10 Essential (primary) hypertension: Secondary | ICD-10-CM | POA: Diagnosis not present

## 2017-05-29 DIAGNOSIS — F329 Major depressive disorder, single episode, unspecified: Secondary | ICD-10-CM | POA: Diagnosis not present

## 2017-05-29 DIAGNOSIS — F0390 Unspecified dementia without behavioral disturbance: Secondary | ICD-10-CM | POA: Diagnosis not present

## 2017-05-29 DIAGNOSIS — R197 Diarrhea, unspecified: Secondary | ICD-10-CM | POA: Diagnosis not present

## 2017-05-29 DIAGNOSIS — Z79899 Other long term (current) drug therapy: Secondary | ICD-10-CM | POA: Diagnosis not present

## 2017-05-29 DIAGNOSIS — Z6821 Body mass index (BMI) 21.0-21.9, adult: Secondary | ICD-10-CM | POA: Diagnosis not present

## 2018-05-05 ENCOUNTER — Other Ambulatory Visit: Payer: Self-pay

## 2018-05-05 ENCOUNTER — Encounter (HOSPITAL_COMMUNITY): Payer: Self-pay

## 2018-05-05 ENCOUNTER — Emergency Department (HOSPITAL_COMMUNITY)
Admission: EM | Admit: 2018-05-05 | Discharge: 2018-05-05 | Disposition: A | Discharge status: Home / Self Care | Payer: Medicare Other | Attending: Emergency Medicine | Admitting: Emergency Medicine

## 2018-05-05 ENCOUNTER — Emergency Department (HOSPITAL_COMMUNITY): Payer: Medicare Other

## 2018-05-05 DIAGNOSIS — M79604 Pain in right leg: Secondary | ICD-10-CM | POA: Insufficient documentation

## 2018-05-05 DIAGNOSIS — M533 Sacrococcygeal disorders, not elsewhere classified: Secondary | ICD-10-CM | POA: Diagnosis not present

## 2018-05-05 DIAGNOSIS — Z5321 Procedure and treatment not carried out due to patient leaving prior to being seen by health care provider: Secondary | ICD-10-CM | POA: Diagnosis not present

## 2018-05-05 DIAGNOSIS — W19XXXA Unspecified fall, initial encounter: Secondary | ICD-10-CM

## 2018-05-05 LAB — CBC WITH DIFFERENTIAL/PLATELET
Basophils Absolute: 0.1 10*3/uL (ref 0.0–0.1)
Basophils Relative: 1 %
EOS PCT: 0 %
Eosinophils Absolute: 0 10*3/uL (ref 0.0–0.7)
HEMATOCRIT: 42.2 % (ref 36.0–46.0)
Hemoglobin: 14.2 g/dL (ref 12.0–15.0)
LYMPHS ABS: 2.1 10*3/uL (ref 0.7–4.0)
LYMPHS PCT: 27 %
MCH: 34.4 pg — ABNORMAL HIGH (ref 26.0–34.0)
MCHC: 33.6 g/dL (ref 30.0–36.0)
MCV: 102.2 fL — AB (ref 78.0–100.0)
MONO ABS: 0.9 10*3/uL (ref 0.1–1.0)
Monocytes Relative: 12 %
NEUTROS ABS: 4.9 10*3/uL (ref 1.7–7.7)
Neutrophils Relative %: 60 %
PLATELETS: 241 10*3/uL (ref 150–400)
RBC: 4.13 MIL/uL (ref 3.87–5.11)
RDW: 12.7 % (ref 11.5–15.5)
WBC: 8 10*3/uL (ref 4.0–10.5)

## 2018-05-05 LAB — COMPREHENSIVE METABOLIC PANEL
ALT: 13 U/L — ABNORMAL LOW (ref 14–54)
AST: 28 U/L (ref 15–41)
Albumin: 3.5 g/dL (ref 3.5–5.0)
Alkaline Phosphatase: 160 U/L — ABNORMAL HIGH (ref 38–126)
Anion gap: 11 (ref 5–15)
BILIRUBIN TOTAL: 0.6 mg/dL (ref 0.3–1.2)
BUN: 5 mg/dL — AB (ref 6–20)
CHLORIDE: 102 mmol/L (ref 101–111)
CO2: 24 mmol/L (ref 22–32)
Calcium: 9 mg/dL (ref 8.9–10.3)
Creatinine, Ser: 0.79 mg/dL (ref 0.44–1.00)
GFR calc Af Amer: >60 mL/min (ref >60)
Glucose, Bld: 113 mg/dL — ABNORMAL HIGH (ref 65–99)
POTASSIUM: 3.5 mmol/L (ref 3.5–5.1)
Sodium: 137 mmol/L (ref 135–145)
TOTAL PROTEIN: 7.4 g/dL (ref 6.5–8.1)

## 2018-05-05 NOTE — ED Provider Notes (Cosign Needed)
MSE was initiated and I personally evaluated the patient and placed orders (if any) at  4:10 PM on May 05, 2018.  The patient appears stable so that the remainder of the MSE may be completed by another provider.  Patient placed in Quick Look pathway, seen and evaluated   Chief Complaint: Tailbone pain/fall  HPI:   Patient fell onto her tailbone about 2 weeks ago.  She states "I thought it would be getting better and I am now."  She complains of pain in her tailbone and now radiating into the right hip.  She is able to ambulate with her cane.  She has not been taking anything for pain.  She does not have any difficulty making bowel movements.  She has not noticed any bruising  ROS: Tailbone pain (one)  Physical Exam:   Gen: No distress  Neuro: Awake and Alert  Skin: Warm    Focused Exam: Point tenderness to palpation over the coccyx no lumbar tenderness   Initiation of care has begun. The patient has been counseled on the process, plan, and necessity for staying for the completion/evaluation, and the remainder of the medical screening examination    Arthor Captain, PA-C 05/05/18 1613

## 2018-05-05 NOTE — ED Notes (Signed)
Pt states that she is leaving due to wait times  

## 2018-05-05 NOTE — ED Triage Notes (Signed)
Pt reports she had a fall at home 2 weeks ago states that today she had pinching in her hip and pain running down the right leg. Pt has not been evaluated since fall d/u no transportation. She reports she was trying to fix her TV antenna when she fell, fall from chair height.

## 2018-05-23 DIAGNOSIS — I10 Essential (primary) hypertension: Secondary | ICD-10-CM | POA: Diagnosis not present

## 2018-05-23 DIAGNOSIS — R52 Pain, unspecified: Secondary | ICD-10-CM | POA: Diagnosis not present

## 2019-07-07 ENCOUNTER — Other Ambulatory Visit: Payer: Self-pay | Admitting: Ophthalmology

## 2019-07-07 DIAGNOSIS — L03213 Periorbital cellulitis: Secondary | ICD-10-CM

## 2019-07-08 ENCOUNTER — Other Ambulatory Visit: Payer: Self-pay

## 2019-07-08 ENCOUNTER — Ambulatory Visit
Admission: RE | Admit: 2019-07-08 | Discharge: 2019-07-08 | Disposition: A | Discharge status: Home / Self Care | Payer: Medicare Other | Source: Ambulatory Visit | Attending: Ophthalmology | Admitting: Ophthalmology

## 2019-07-08 DIAGNOSIS — L03213 Periorbital cellulitis: Secondary | ICD-10-CM | POA: Diagnosis not present

## 2019-07-08 LAB — POCT I-STAT CREATININE: Creatinine, Ser: 0.8 mg/dL (ref 0.44–1.00)

## 2019-07-08 MED ORDER — IOHEXOL 300 MG/ML  SOLN
75.0000 mL | Freq: Once | INTRAMUSCULAR | Status: AC | PRN
  Administered 2019-07-08: 75 mL via INTRAVENOUS

## 2021-11-23 ENCOUNTER — Emergency Department (HOSPITAL_COMMUNITY): Payer: Medicare Other

## 2021-11-23 ENCOUNTER — Encounter (HOSPITAL_COMMUNITY): Payer: Self-pay | Admitting: Emergency Medicine

## 2021-11-23 ENCOUNTER — Observation Stay (HOSPITAL_COMMUNITY)
Admission: EM | Admit: 2021-11-23 | Discharge: 2021-11-24 | Disposition: A | Discharge status: Home / Self Care | Payer: Medicare Other | Attending: Urology | Admitting: Urology

## 2021-11-23 DIAGNOSIS — S32592A Other specified fracture of left pubis, initial encounter for closed fracture: Secondary | ICD-10-CM | POA: Diagnosis not present

## 2021-11-23 DIAGNOSIS — W19XXXA Unspecified fall, initial encounter: Secondary | ICD-10-CM | POA: Diagnosis not present

## 2021-11-23 DIAGNOSIS — M25552 Pain in left hip: Secondary | ICD-10-CM | POA: Diagnosis present

## 2021-11-23 DIAGNOSIS — Z7982 Long term (current) use of aspirin: Secondary | ICD-10-CM | POA: Diagnosis not present

## 2021-11-23 DIAGNOSIS — Z20822 Contact with and (suspected) exposure to covid-19: Secondary | ICD-10-CM | POA: Diagnosis not present

## 2021-11-23 DIAGNOSIS — Y92009 Unspecified place in unspecified non-institutional (private) residence as the place of occurrence of the external cause: Secondary | ICD-10-CM | POA: Insufficient documentation

## 2021-11-23 DIAGNOSIS — S79912A Unspecified injury of left hip, initial encounter: Secondary | ICD-10-CM | POA: Diagnosis present

## 2021-11-23 DIAGNOSIS — Z87891 Personal history of nicotine dependence: Secondary | ICD-10-CM | POA: Diagnosis not present

## 2021-11-23 DIAGNOSIS — E876 Hypokalemia: Secondary | ICD-10-CM | POA: Diagnosis present

## 2021-11-23 DIAGNOSIS — R2689 Other abnormalities of gait and mobility: Secondary | ICD-10-CM | POA: Insufficient documentation

## 2021-11-23 LAB — BASIC METABOLIC PANEL
Anion gap: 10 (ref 5–15)
BUN: 5 mg/dL — ABNORMAL LOW (ref 8–23)
CO2: 27 mmol/L (ref 22–32)
Calcium: 8.6 mg/dL — ABNORMAL LOW (ref 8.9–10.3)
Chloride: 94 mmol/L — ABNORMAL LOW (ref 98–111)
Creatinine, Ser: 0.82 mg/dL (ref 0.44–1.00)
GFR, Estimated: >60 mL/min (ref >60)
Glucose, Bld: 99 mg/dL (ref 70–99)
Potassium: 3.2 mmol/L — ABNORMAL LOW (ref 3.5–5.1)
Sodium: 131 mmol/L — ABNORMAL LOW (ref 135–145)

## 2021-11-23 LAB — CBC WITH DIFFERENTIAL/PLATELET
Abs Immature Granulocytes: 0.03 10*3/uL (ref 0.00–0.07)
Basophils Absolute: 0.1 10*3/uL (ref 0.0–0.1)
Basophils Relative: 1 %
Eosinophils Absolute: 0.1 10*3/uL (ref 0.0–0.5)
Eosinophils Relative: 2 %
HCT: 37.2 % (ref 36.0–46.0)
Hemoglobin: 12.4 g/dL (ref 12.0–15.0)
Immature Granulocytes: 0 %
Lymphocytes Relative: 29 %
Lymphs Abs: 2.1 10*3/uL (ref 0.7–4.0)
MCH: 33.2 pg (ref 26.0–34.0)
MCHC: 33.3 g/dL (ref 30.0–36.0)
MCV: 99.5 fL (ref 80.0–100.0)
Monocytes Absolute: 0.7 10*3/uL (ref 0.1–1.0)
Monocytes Relative: 10 %
Neutro Abs: 4.3 10*3/uL (ref 1.7–7.7)
Neutrophils Relative %: 58 %
Platelets: 146 10*3/uL — ABNORMAL LOW (ref 150–400)
RBC: 3.74 MIL/uL — ABNORMAL LOW (ref 3.87–5.11)
RDW: 13.8 % (ref 11.5–15.5)
WBC: 7.3 10*3/uL (ref 4.0–10.5)
nRBC: 0 % (ref 0.0–0.2)

## 2021-11-23 LAB — URINALYSIS, ROUTINE W REFLEX MICROSCOPIC
Bilirubin Urine: NEGATIVE
Glucose, UA: NEGATIVE mg/dL
Ketones, ur: NEGATIVE mg/dL
Nitrite: POSITIVE — AB
Protein, ur: NEGATIVE mg/dL
Specific Gravity, Urine: <1.005 — ABNORMAL LOW (ref 1.005–1.030)
pH: 5.5 (ref 5.0–8.0)

## 2021-11-23 LAB — RESP PANEL BY RT-PCR (FLU A&B, COVID) ARPGX2
Influenza A by PCR: NEGATIVE
Influenza B by PCR: NEGATIVE
SARS Coronavirus 2 by RT PCR: NEGATIVE

## 2021-11-23 LAB — URINALYSIS, MICROSCOPIC (REFLEX)

## 2021-11-23 MED ORDER — FENTANYL CITRATE PF 50 MCG/ML IJ SOSY
50.0000 ug | PREFILLED_SYRINGE | INTRAMUSCULAR | Status: DC | PRN
  Administered 2021-11-24: 50 ug via INTRAVENOUS
  Filled 2021-11-23: qty 1

## 2021-11-23 MED ORDER — ACETAMINOPHEN 650 MG RE SUPP: 650.0000 mg | Freq: Four times a day (QID) | RECTAL | Status: DC | PRN

## 2021-11-23 MED ORDER — POTASSIUM CHLORIDE CRYS ER 20 MEQ PO TBCR
40.0000 meq | EXTENDED_RELEASE_TABLET | Freq: Once | ORAL | Status: AC
  Administered 2021-11-24: 40 meq via ORAL
  Filled 2021-11-23: qty 2

## 2021-11-23 MED ORDER — HYDROCODONE-ACETAMINOPHEN 5-325 MG PO TABS
1.0000 | ORAL_TABLET | Freq: Once | ORAL | Status: AC
  Administered 2021-11-23: 1 via ORAL
  Filled 2021-11-23: qty 1

## 2021-11-23 MED ORDER — ACETAMINOPHEN 325 MG PO TABS: 650.0000 mg | ORAL_TABLET | Freq: Four times a day (QID) | ORAL | Status: DC | PRN

## 2021-11-23 MED ORDER — NALOXONE HCL 0.4 MG/ML IJ SOLN: 0.4000 mg | INTRAMUSCULAR | Status: DC | PRN

## 2021-11-23 NOTE — ED Provider Notes (Signed)
Emergency Medicine Provider Triage Evaluation Note  Joy Higgins , a 75 y.o. female  was evaluated in triage.  Pt complains of hip pain status post mechanical fall.  Patient states "if I walk it ", have not been walking since the accident.  Pain is to bilateral hips, lumbar spine.  No medication tried to arrival.  Not strike her head, no loss of consciousness, currently on no blood thinners.  Patient lives with her brother.  Review of Systems  Positive: Back pain, hip pain Negative: Urinary difficulty.  Physical Exam  BP (!) 133/95   Pulse 98   Temp 98.3 F (36.8 C) (Oral)   Resp 16   SpO2 94%  Gen:   Awake, no distress   Resp:  Normal effort  MSK:   Moves extremities without difficulty  Other:  Only able to range bilateral legs with active ROM.  Medical Decision Making  Medically screening exam initiated at 2:07 PM.  Appropriate orders placed.  RUTA CAPECE was informed that the remainder of the evaluation will be completed by another provider, this initial triage assessment does not replace that evaluation, and the importance of remaining in the ED until their evaluation is complete.  Here status post fall.  No LOC, no blood thinners.  X-rays have been ordered.of note she does not walk with assisted device.    Claude Manges, PA-C 11/23/21 1408    Horton, Clabe Seal, DO 11/24/21 1022

## 2021-11-23 NOTE — ED Triage Notes (Signed)
Patient BIB [facility] from home for evaluation of a fall three days ago that left her unable to walk due to hip and lower back pain. Patient denies LOC.

## 2021-11-23 NOTE — ED Notes (Signed)
ED Provider at bedside. 

## 2021-11-23 NOTE — ED Provider Notes (Signed)
[facility] EMERGENCY DEPARTMENT Provider Note   CSN: [id number] Arrival date & time: 11/23/21  1357     History Chief Complaint  Patient presents with   Joy Higgins is a 75 y.o. female.  The history is provided by the patient, medical records and the EMS personnel.  Fall This is a new problem. The current episode started more than 2 days ago. The problem has been gradually worsening. Pertinent negatives include no chest pain, no abdominal pain, no headaches and no shortness of breath. The symptoms are aggravated by walking and standing. Nothing relieves the symptoms. She has tried nothing for the symptoms. The treatment provided no relief.      Past Medical History:  Diagnosis Date   Arthritis    Chest pain on exertion 05/06/2013   Dyspnea on exertion 05/06/2013   GERD (gastroesophageal reflux disease)    Hypercholesterolemia    Osteoporosis    Tachypalpitations 05/06/2013    Patient Active Problem List   Diagnosis Date Noted   Closed fracture of left inferior pubic ramus ([place]) 11/23/2021   Dyspnea on exertion 05/06/2013   Chest pain on exertion 05/06/2013   Tachypalpitations 05/06/2013   Tobacco abuse 05/06/2013   Nonspecific abnormal electrocardiogram (ECG) (EKG) 05/06/2013    Past Surgical History:  Procedure Laterality Date   CARDIOVASCULAR STRESS TEST  10-19-2009   EF 82%   HERNIA REPAIR     and Stomach Repair at age 52   TONSILLECTOMY     VAGINAL HYSTERECTOMY     Complete     OB History   No obstetric history on file.     No family history on file.  Social History   Tobacco Use   Smoking status: Former    Types: Cigarettes    Quit date: 05/02/2013    Years since quitting: 8.5   Smokeless tobacco: Never  Substance Use Topics   Alcohol use: Yes    Home Medications Prior to Admission medications   Medication Sig Start Date End Date Taking? Authorizing Provider  alendronate (FOSAMAX) 70 MG tablet  04/09/13    [provider]  aspirin EC 81 MG tablet Take 1 tablet (81 mg total) by mouth daily. 05/06/13   Deboraha Sprang, MD  donepezil (ARICEPT) 10 MG tablet  04/09/13   [provider]  meloxicam (MOBIC) 7.5 MG tablet  04/15/13   [provider]  metoprolol succinate (TOPROL-XL) 25 MG 24 hr tablet Take 25 mg by mouth daily.    [provider]  montelukast (SINGULAIR) 10 MG tablet  04/09/13   [provider]  Multiple Vitamins-Minerals (MULTIVITAMIN PO) Take by mouth daily.    [provider]  omeprazole (PRILOSEC) 20 MG capsule  04/09/13   [provider]    Allergies    Patient has no known allergies.  Review of Systems   Review of Systems  Constitutional:  Negative for chills and fever.  HENT:  Negative for ear pain and sore throat.   Eyes:  Negative for pain and visual disturbance.  Respiratory:  Negative for cough and shortness of breath.   Cardiovascular:  Negative for chest pain and palpitations.  Gastrointestinal:  Negative for abdominal pain and vomiting.  Genitourinary:  Negative for dysuria and hematuria.  Musculoskeletal:  Positive for back pain. Negative for arthralgias.  Skin:  Negative for color change and rash.  Neurological:  Negative for seizures, syncope and headaches.  All other systems reviewed and are  negative.  Physical Exam Updated Vital Signs BP (!) 143/95 (BP Location: Left Arm)   Pulse 89   Temp 97.8 F (36.6 C) (Oral)   Resp 18   SpO2 97%   Physical Exam Vitals and nursing note reviewed.  Constitutional:      General: She is not in acute distress.    Appearance: Normal appearance. She is well-developed.  HENT:     Head: Normocephalic and atraumatic.     Right Ear: External ear normal.     Left Ear: External ear normal.     Nose: Nose normal. No congestion or rhinorrhea.     Mouth/Throat:     Mouth: Mucous membranes are moist.  Eyes:     Extraocular Movements: Extraocular movements intact.      Conjunctiva/sclera: Conjunctivae normal.     Pupils: Pupils are equal, round, and reactive to light.  Cardiovascular:     Rate and Rhythm: Normal rate and regular rhythm.     Pulses: Normal pulses.     Heart sounds: No murmur heard. Pulmonary:     Effort: Pulmonary effort is normal. No respiratory distress.     Breath sounds: Normal breath sounds. No wheezing, rhonchi or rales.  Abdominal:     General: Abdomen is flat. Bowel sounds are normal.     Palpations: Abdomen is soft.     Tenderness: There is no abdominal tenderness. There is no guarding or rebound.  Musculoskeletal:        General: Tenderness (Left lateral hip and groin.  Sensation intact throughout left lower extremity.  2+ DP PT pulses.  Movement limited secondary to pain) present. No swelling or deformity.     Cervical back: Normal range of motion and neck supple. No rigidity or tenderness.  Skin:    General: Skin is warm and dry.     Capillary Refill: Capillary refill takes less than 2 seconds.  Neurological:     General: No focal deficit present.     Mental Status: She is alert and oriented to person, place, and time.  Psychiatric:        Mood and Affect: Mood normal.    ED Results / Procedures / Treatments   Labs (all labs ordered are listed, but only abnormal results are displayed) Labs Reviewed  CBC WITH DIFFERENTIAL/PLATELET - Abnormal; Notable for the following components:      Result Value   RBC 3.74 (*)    Platelets 146 (*)    All other components within normal limits  BASIC METABOLIC PANEL - Abnormal; Notable for the following components:   Sodium 131 (*)    Potassium 3.2 (*)    Chloride 94 (*)    BUN 5 (*)    Calcium 8.6 (*)    All other components within normal limits  RESP PANEL BY RT-PCR (FLU A&B, COVID) ARPGX2  URINALYSIS, ROUTINE W REFLEX MICROSCOPIC  MAGNESIUM  MAGNESIUM  COMPREHENSIVE METABOLIC PANEL  CBC  VITAMIN D 25 HYDROXY (VIT D DEFICIENCY, FRACTURES)     EKG None  Radiology DG Lumbar Spine Complete  Result Date: 11/23/2021 CLINICAL DATA:  Back and pelvic pain, initial encounter EXAM: LUMBAR SPINE - COMPLETE 4+ VIEW COMPARISON:  None. FINDINGS: Five lumbar type vertebral bodies are well visualized. Vertebral body height is well maintained. Facet hypertrophic changes are noted in the lower lumbar spine. No pars defects are seen. Mild osteophytic changes are noted. No abnormal mass or abnormal calcifications are seen. Mild scoliosis concave to the left is noted. IMPRESSION:  Mild degenerative change without acute abnormality. Electronically Signed   By: Inez Catalina M.D.   On: 11/23/2021 17:50   DG Hip Unilat W or Wo Pelvis 2-3 Views Left  Result Date: 11/23/2021 CLINICAL DATA:  Back and pelvic pain, initial encounter EXAM: DG HIP (WITH OR WITHOUT PELVIS) 3V LEFT COMPARISON:  05/23/2018 FINDINGS: Healed superior and inferior pubic ramus fractures are noted on the right. New superior and inferior pubic rami fractures are noted on the left. Pelvic ring is otherwise intact. Proximal left femur appears within normal limits. No dislocation is seen. No soft tissue abnormality is noted. IMPRESSION: Left superior and inferior pubic ramus fractures. Electronically Signed   By: Inez Catalina M.D.   On: 11/23/2021 17:51   DG Hip Unilat W or Wo Pelvis 2-3 Views Right  Result Date: 11/23/2021 CLINICAL DATA:  Pelvic and hip pain, initial encounter EXAM: DG HIP (WITH OR WITHOUT PELVIS) 2V RIGHT COMPARISON:  05/23/2018 FINDINGS: Previously seen superior and inferior pubic ramus fractures on the right now show interval healing. No acute fracture or dislocation in the proximal right femur is noted. Left pubic rami fractures are noted similar to that seen on the left hip film. IMPRESSION: Healed right superior and inferior pubic rami fractures. Known left pubic rami fractures. Electronically Signed   By: Inez Catalina M.D.   On: 11/23/2021 17:52     Procedures Procedures   Medications Ordered in ED Medications  acetaminophen (TYLENOL) tablet 650 mg (has no administration in time range)    Or  acetaminophen (TYLENOL) suppository 650 mg (has no administration in time range)  naloxone (NARCAN) injection 0.4 mg (has no administration in time range)  fentaNYL (SUBLIMAZE) injection 50 mcg (has no administration in time range)  potassium chloride SA (KLOR-CON M) CR tablet 40 mEq (has no administration in time range)  HYDROcodone-acetaminophen (NORCO/VICODIN) 5-325 MG per tablet 1 tablet (1 tablet Oral Given 11/23/21 1838)    ED Course  I have reviewed the triage vital signs and the nursing notes.  Pertinent labs & imaging results that were available during my care of the patient were reviewed by me and considered in my medical decision making (see chart for details).    MDM Rules/Calculators/A&P                          75 year old female with no past medical problems presenting with bilateral hip pain, low back pain status post mechanical fall.  She is afebrile and hemodynamic stable.  She had no head trauma or loss of consciousness.  She is on any anticoagulation.  Exam as above.  She is neurovascularly intact.  She has no saddle anesthesia or urinary or fecal incontinence.  Low concern for cauda equina or epidural hematoma.  X-rays obtained.  Concerning for closed left superior and inferior pubic rami fractures.  No indications for emergent orthopedic evaluation at this time.  Will require admission for PT, OT evaluation.  Anticipate orthopedics evaluation in house.  Medicine team contacted.  Handoff given.  Final Clinical Impression(s) / ED Diagnoses Final diagnoses:  Closed fracture of multiple pubic rami, left, initial encounter [facility])    Rx / [place] Orders ED Discharge Orders     None        Idamae Lusher, MD 11/23/21 2016    Lacretia Leigh, MD 11/27/21 1031

## 2021-11-23 NOTE — H&P (Signed)
History and Physical    PLEASE NOTE THAT DRAGON DICTATION SOFTWARE WAS USED IN THE CONSTRUCTION OF THIS NOTE.   Joy Higgins KGS:[id number] DOB: Jul 27, 1946 DOA: 11/23/2021  PCP: Lonie Peak, PA-C  Patient coming from: home   I have personally briefly reviewed patient's old medical records in [facility] Link  Chief Complaint: Left hip pain  HPI: Joy Higgins is a 75 y.o. female with medical history significant for gerd who is admitted to [facility] 11/23/2021 with acute left pubic rami fractures after presenting from home to [facility] ED complaining of left hip pain following ground-level fall.   Three days ago, patient reports tripping while attempting to ambulate at home, resulting in a ground level fall during which she fell backwards into her left, striking her left hip was the principal point of contact with the floor below. As a result of this fall, the patient reports immediate development of sharp bilateral hip pain, worse on the left than right, with radiation into the left groin. States that this pain has been constant since onset, and progressive in intensity, resulting in her ultimate presentation to [facility] emergency department for further evaluation given ensuing development of difficulty with ambulation due to poor pain tolerance given progression of this intensity.  She notes exacerbation of her left hip pain when attempting to move the left lower extremity.  This is relative to baseline functional status in which the patient lives independently as well as baseline ambulatory status in which the patient reports the ability to ambulate without assistance, including no need for support devices.  Otherwise, denies any acute arthralgias or myalgias as a result of the above fall.  Denies any acute numbness or paresthesias in bilateral lower extremities.   Fall was unwitnessed, but the patient reports that she did not hit head as a component of this fall, and denies any  associated loss of consciousness.  Denies any preceding or associated chest pain, shortness of breath, diaphoresis, palpitations, nausea, vomiting, dizziness, presyncope, or syncope.  Denies any subsequent headache, neck pain, blurry vision, or diplopia.  Not on any blood thinners as an outpatient, including no aspirin.  Denies any known history of coronary artery disease or CHF. denies any recent orthopnea, PND, or peripheral edema.     ED Course:  Vital signs in the ED were notable for the following: Afebrile; initial heart rate 98, which decreased to 87 following interval analgesic intervention, as further detailed below; blood pressure 133/95; respiratory rate 17-18, oxygen saturation 97 to 99% on room air  Labs were notable for the following: BMP notable for the following: Potassium 3.2, bicarbonate 27, creatinine 0.82, glucose 99.  CBC notable for white cell count 7300, hemoglobin 12.4.  COVID-19/influenza PCR checked in the ED today and found to be negative.  Imaging and additional notable ED work-up: Plain films of the bilateral hip/pelvis showed left superior/inferior pubic rami fracture without evidence of dislocation.  While in the ED, the following were administered: Norco 5/325 mg p.o. x1.  Socially, the patient was admitted to MedSurg unit for further evaluation management of presenting left pubic rami fractures, including pain control given her current inability to bear weight left lower extremity due to inadequate associated pain tolerance relative to her baseline functional/amatory status in which she lives independently/ambulates independently.      Review of Systems: As per HPI otherwise 10 point review of systems negative.   Past Medical History:  Diagnosis Date   Arthritis  Chest pain on exertion 05/06/2013   Dyspnea on exertion 05/06/2013   GERD (gastroesophageal reflux disease)    Hypercholesterolemia    Osteoporosis    Tachypalpitations 05/06/2013    Past Surgical  History:  Procedure Laterality Date   CARDIOVASCULAR STRESS TEST  10-19-2009   EF 82%   HERNIA REPAIR     and Stomach Repair at age 41   TONSILLECTOMY     VAGINAL HYSTERECTOMY     Complete    Social History:  reports that she quit smoking about 8 years ago. She has never used smokeless tobacco. She reports current alcohol use. No history on file for drug use.   No Known Allergies   Family history reviewed and not pertinent    Prior to Admission medications   Medication Sig Start Date End Date Taking? Authorizing Provider  alendronate (FOSAMAX) 70 MG tablet  04/09/13   [provider]  aspirin EC 81 MG tablet Take 1 tablet (81 mg total) by mouth daily. 05/06/13   Duke Salvia, MD  donepezil (ARICEPT) 10 MG tablet  04/09/13   [provider]  meloxicam (MOBIC) 7.5 MG tablet  04/15/13   [provider]  metoprolol succinate (TOPROL-XL) 25 MG 24 hr tablet Take 25 mg by mouth daily.    [provider]  montelukast (SINGULAIR) 10 MG tablet  04/09/13   [provider]  Multiple Vitamins-Minerals (MULTIVITAMIN PO) Take by mouth daily.    [provider]  omeprazole (PRILOSEC) 20 MG capsule  04/09/13   [provider]     Objective    Physical Exam: Vitals:   11/23/21 1404 11/23/21 1753 11/23/21 1923  BP: (!) 133/95 (!) 131/104 (!) 143/95  Pulse: 98 92 89  Resp: 16 18 18   Temp: 98.3 F (36.8 C) 98.2 F (36.8 C) 97.8 F (36.6 C)  TempSrc: Oral Oral Oral  SpO2: 94% 97% 97%    General: appears to be stated age; alert, oriented Skin: warm, dry, no rash Head:  AT/[place] Mouth:  Oral mucosa membranes appear moist, normal dentition Neck: supple; trachea midline Heart:  RRR; did not appreciate any M/R/G Lungs: CTAB, did not appreciate any wheezes, rales, or rhonchi Abdomen: + BS; soft, ND, NT Vascular: 2+ pedal pulses b/l; 2+ radial pulses b/l Extremities: no peripheral edema, no muscle wasting Neuro:  sensation intact  in upper and lower extremities b/l; deferred strength assessment of left lower extremity in the setting of the superior/inferior pubic rami fracture    Labs on Admission: I have personally reviewed following labs and imaging studies  CBC: Recent Labs  Lab 11/23/21 1846  WBC 7.3  NEUTROABS 4.3  HGB 12.4  HCT 37.2  MCV 99.5  PLT 146*   Basic Metabolic Panel: Recent Labs  Lab 11/23/21 1846  NA 131*  K 3.2*  CL 94*  CO2 27  GLUCOSE 99  BUN 5*  CREATININE 0.82  CALCIUM 8.6*   GFR: CrCl cannot be calculated (Unknown ideal weight.). Liver Function Tests: No results for input(s): AST, ALT, ALKPHOS, BILITOT, PROT, ALBUMIN in the last 168 hours. No results for input(s): LIPASE, AMYLASE in the last 168 hours. No results for input(s): AMMONIA in the last 168 hours. Coagulation Profile: No results for input(s): INR, PROTIME in the last 168 hours. Cardiac Enzymes: No results for input(s): CKTOTAL, CKMB, CKMBINDEX, TROPONINI in the last 168 hours. BNP (last 3 results) No results for input(s): PROBNP in the last 8760 hours. HbA1C: No results for input(s): HGBA1C  in the last 72 hours. CBG: No results for input(s): GLUCAP in the last 168 hours. Lipid Profile: No results for input(s): CHOL, HDL, LDLCALC, TRIG, CHOLHDL, LDLDIRECT in the last 72 hours. Thyroid Function Tests: No results for input(s): TSH, T4TOTAL, FREET4, T3FREE, THYROIDAB in the last 72 hours. Anemia Panel: No results for input(s): VITAMINB12, FOLATE, FERRITIN, TIBC, IRON, RETICCTPCT in the last 72 hours. Urine analysis: No results found for: COLORURINE, APPEARANCEUR, LABSPEC, PHURINE, GLUCOSEU, HGBUR, BILIRUBINUR, KETONESUR, PROTEINUR, UROBILINOGEN, NITRITE, LEUKOCYTESUR  Radiological Exams on Admission: DG Lumbar Spine Complete  Result Date: 11/23/2021 CLINICAL DATA:  Back and pelvic pain, initial encounter EXAM: LUMBAR SPINE - COMPLETE 4+ VIEW COMPARISON:  None. FINDINGS: Five lumbar type vertebral bodies are  well visualized. Vertebral body height is well maintained. Facet hypertrophic changes are noted in the lower lumbar spine. No pars defects are seen. Mild osteophytic changes are noted. No abnormal mass or abnormal calcifications are seen. Mild scoliosis concave to the left is noted. IMPRESSION: Mild degenerative change without acute abnormality. Electronically Signed   By: Alcide Clever M.D.   On: 11/23/2021 17:50   DG Hip Unilat W or Wo Pelvis 2-3 Views Left  Result Date: 11/23/2021 CLINICAL DATA:  Back and pelvic pain, initial encounter EXAM: DG HIP (WITH OR WITHOUT PELVIS) 3V LEFT COMPARISON:  05/23/2018 FINDINGS: Healed superior and inferior pubic ramus fractures are noted on the right. New superior and inferior pubic rami fractures are noted on the left. Pelvic ring is otherwise intact. Proximal left femur appears within normal limits. No dislocation is seen. No soft tissue abnormality is noted. IMPRESSION: Left superior and inferior pubic ramus fractures. Electronically Signed   By: Alcide Clever M.D.   On: 11/23/2021 17:51   DG Hip Unilat W or Wo Pelvis 2-3 Views Right  Result Date: 11/23/2021 CLINICAL DATA:  Pelvic and hip pain, initial encounter EXAM: DG HIP (WITH OR WITHOUT PELVIS) 2V RIGHT COMPARISON:  05/23/2018 FINDINGS: Previously seen superior and inferior pubic ramus fractures on the right now show interval healing. No acute fracture or dislocation in the proximal right femur is noted. Left pubic rami fractures are noted similar to that seen on the left hip film. IMPRESSION: Healed right superior and inferior pubic rami fractures. Known left pubic rami fractures. Electronically Signed   By: Alcide Clever M.D.   On: 11/23/2021 17:52       Assessment/Plan   Principal Problem:   Closed fracture of left inferior pubic ramus (HCC) Active Problems:   Left hip pain   Fall at home, initial encounter   Hypokalemia     #) Acute superior/inferior pubic rami fracture without dislocation:  confirmed via presenting plain films and stemming from ground level mechanical fall without associated loss of consciousness that occurred 3 days ago with interval progression intensity of associated left hip discomfort ultimately resulting in inability to ambulate due to progressive pain intolerance, patient's baseline functional/ambulatory status in which she lives independently and ambulates without assistance.   At this time, the bilateral lower extremities appear neurovascularly intact. Not on any blood thinners at home, including no aspirin.  The setting of these known cervical fractures, the goal of care will be optimization of pain control, early ambulation, and involvement of PT/OT.     Plan: Prn IV fentanyl.  PT/OT consults have been placed for the morning.  check 25-hydroxy vit D level.  Fall precautions ordered.  Check urinalysis:     #) Ground level mechanical fall: The patient reports a ground  level mechanical fall 3 days ago in which she tripped without any associated loss of consciousness. Appears to be purely mechanical in nature, without clinical evidence at this time to suggest contributory dizziness, presyncope, syncope, or acute ischemic CVA. Does not appear to have hit head as component of this fall.  presentation does not appear to be associated any acute neurologic deficits.  While this fall appears to be purely mechanical in nature, will also check urinalysis to evaluate for any underlying infectious contribution.   Plan: Check urinalysis, as above.  Repeat BMP and CBC with differential in the morning. Fall precautions.  PT OT consults placed, as above.  CMP/CBC in the morning.      #) Hypokalemia: Presenting serum potassium 3.2.  Plan: Potassium chloride 40 mEq p.o. x1 dose now.  Add on serum magnesium level.  Repeat BMP in the morning.     DVT prophylaxis: SCD's   Code Status: Full code Family Communication: none Disposition Plan: Per Rounding Team Consults  called: none;  Admission status: Melynda Ripple; MedSurg    PLEASE NOTE THAT DRAGON DICTATION SOFTWARE WAS USED IN THE CONSTRUCTION OF THIS NOTE.   Chaney Born Sylus Stgermain DO [facility]  From 7PM - 7AM   11/23/2021, 8:10 PM

## 2021-11-24 ENCOUNTER — Encounter (HOSPITAL_COMMUNITY): Payer: Self-pay | Admitting: Internal Medicine

## 2021-11-24 DIAGNOSIS — E876 Hypokalemia: Secondary | ICD-10-CM | POA: Diagnosis present

## 2021-11-24 DIAGNOSIS — M25552 Pain in left hip: Secondary | ICD-10-CM | POA: Diagnosis present

## 2021-11-24 DIAGNOSIS — S32592A Other specified fracture of left pubis, initial encounter for closed fracture: Secondary | ICD-10-CM | POA: Diagnosis not present

## 2021-11-24 DIAGNOSIS — Y92009 Unspecified place in unspecified non-institutional (private) residence as the place of occurrence of the external cause: Secondary | ICD-10-CM

## 2021-11-24 DIAGNOSIS — W19XXXA Unspecified fall, initial encounter: Secondary | ICD-10-CM

## 2021-11-24 LAB — CBC
HCT: 38 % (ref 36.0–46.0)
Hemoglobin: 12.4 g/dL (ref 12.0–15.0)
MCH: 33.2 pg (ref 26.0–34.0)
MCHC: 32.6 g/dL (ref 30.0–36.0)
MCV: 101.9 fL — ABNORMAL HIGH (ref 80.0–100.0)
Platelets: 135 10*3/uL — ABNORMAL LOW (ref 150–400)
RBC: 3.73 MIL/uL — ABNORMAL LOW (ref 3.87–5.11)
RDW: 14 % (ref 11.5–15.5)
WBC: 6.1 10*3/uL (ref 4.0–10.5)
nRBC: 0 % (ref 0.0–0.2)

## 2021-11-24 LAB — COMPREHENSIVE METABOLIC PANEL
ALT: 8 U/L (ref 0–44)
AST: 18 U/L (ref 15–41)
Albumin: 2.7 g/dL — ABNORMAL LOW (ref 3.5–5.0)
Alkaline Phosphatase: 118 U/L (ref 38–126)
Anion gap: 7 (ref 5–15)
BUN: 7 mg/dL — ABNORMAL LOW (ref 8–23)
CO2: 25 mmol/L (ref 22–32)
Calcium: 8.5 mg/dL — ABNORMAL LOW (ref 8.9–10.3)
Chloride: 101 mmol/L (ref 98–111)
Creatinine, Ser: 0.81 mg/dL (ref 0.44–1.00)
GFR, Estimated: >60 mL/min (ref >60)
Glucose, Bld: 76 mg/dL (ref 70–99)
Potassium: 3.8 mmol/L (ref 3.5–5.1)
Sodium: 133 mmol/L — ABNORMAL LOW (ref 135–145)
Total Bilirubin: 0.9 mg/dL (ref 0.3–1.2)
Total Protein: 6.5 g/dL (ref 6.5–8.1)

## 2021-11-24 LAB — MAGNESIUM: Magnesium: 1.9 mg/dL (ref 1.7–2.4)

## 2021-11-24 LAB — VITAMIN D 25 HYDROXY (VIT D DEFICIENCY, FRACTURES): Vit D, 25-Hydroxy: 24.7 ng/mL — ABNORMAL LOW (ref 30–100)

## 2021-11-24 MED ORDER — HYDROCODONE-ACETAMINOPHEN 5-325 MG PO TABS: 1.0000 | ORAL_TABLET | ORAL | Status: DC | PRN

## 2021-11-24 MED ORDER — ACETAMINOPHEN 500 MG PO TABS: 500.0000 mg | ORAL_TABLET | Freq: Four times a day (QID) | ORAL | Status: AC | PRN

## 2021-11-24 MED ORDER — HYDROCODONE-ACETAMINOPHEN 5-325 MG PO TABS: 1.0000 | ORAL_TABLET | Freq: Four times a day (QID) | ORAL | 0 refills | Status: AC | PRN

## 2021-11-24 NOTE — ED Notes (Signed)
Patient resting with eyes closed at this time. Chest rise and fall visualized. NAD present.  

## 2021-11-24 NOTE — Evaluation (Signed)
Physical Therapy Evaluation Patient Details Name: Joy Higgins MRN: [id number] DOB: [birth year] Today's Date: 11/24/2021  History of Present Illness  Pt is a 75 year old woman admitted 11/23/21 with worsening L hip pain after ground level fall. Imaging +acute L superior and inferior pubic rami fx. PMH: GERD, arthritis, HLD, osteoporosis.  Clinical Impression  Pt admitted secondary to problem above with deficits below. Mobility limited secondary to pain. Requiring min guard to min A for mobility tasks using RW. Educated about using RW instead of rollator for increased safety, but pt insistent on using rollator. Recommend HHPT at d/c to address current deficits as pt adamant about returning home. Will continue to follow acutely.        Recommendations for follow up therapy are one component of a multi-disciplinary discharge planning process, led by the attending physician.  Recommendations may be updated based on patient status, additional functional criteria and insurance authorization.  Follow Up Recommendations Home health PT (refusing SNF)    Assistance Recommended at Discharge Frequent or constant Supervision/Assistance  Functional Status Assessment Patient has had a recent decline in their functional status and demonstrates the ability to make significant improvements in function in a reasonable and predictable amount of time.  Equipment Recommendations  None recommended by PT (refusing RW)    Recommendations for Other Services       Precautions / Restrictions Precautions Precautions: Fall Restrictions Weight Bearing Restrictions: Yes RLE Weight Bearing: Weight bearing as tolerated LLE Weight Bearing: Weight bearing as tolerated      Mobility  Bed Mobility Overal bed mobility: Needs Assistance Bed Mobility: Supine to Sit;Sit to Supine     Supine to sit: Min assist Sit to supine: Min assist   General bed mobility comments: assist for L LE in and out of bed     Transfers Overall transfer level: Needs assistance Equipment used: Rolling walker (2 wheels) Transfers: Sit to/from Stand Sit to Stand: Min assist           General transfer comment: cues for hand placement, assist to rise and steady    Ambulation/Gait Ambulation/Gait assistance: Min guard Gait Distance (Feet): 15 Feet Assistive device: Rolling walker (2 wheels) Gait Pattern/deviations: Step-to pattern;Decreased step length - right;Decreased step length - left;Decreased weight shift to left;Antalgic Gait velocity: Decreased     General Gait Details: Slow, antalgic gait. Min guard for safety. Gait limited to within ED room secondary to pain.  Stairs            Wheelchair Mobility    Modified Rankin (Stroke Patients Only)       Balance Overall balance assessment: Needs assistance Sitting-balance support: No upper extremity supported Sitting balance-Leahy Scale: Good     Standing balance support: Bilateral upper extremity supported Standing balance-Leahy Scale: Poor Standing balance comment: Reliant on BUE support                             Pertinent Vitals/Pain Pain Assessment: 0-10 Pain Score: 10-Worst pain ever Pain Location: pelvis Pain Descriptors / Indicators: Grimacing;Guarding;Discomfort Pain Intervention(s): Limited activity within patient's tolerance;Monitored during session;Repositioned    Home Living Family/patient expects to be discharged to:: Private residence Living Arrangements: Other relatives (brother) Available Help at Discharge: Family;Available 24 hours/day Type of Home: Mobile home Home Access: Stairs to enter Entrance Stairs-Rails: Right Entrance Stairs-Number of Steps: 4   Home Layout: One level Home Equipment: BSC/3in1;Cane - single point;Rollator (4 wheels);Shower seat;Grab bars - tub/shower;Hand  held shower head      Prior Function Prior Level of Function : Independent/Modified Independent              Mobility Comments: OCcasionally uses cane vs rollator ADLs Comments: Independent with ADL and IADLs, does not drive     Hand Dominance   Dominant Hand: Right    Extremity/Trunk Assessment   Upper Extremity Assessment Upper Extremity Assessment: Defer to OT evaluation    Lower Extremity Assessment Lower Extremity Assessment: LLE deficits/detail LLE Deficits / Details: Limited ROM at hip secondary to pain. Having to help LLE off of bed.       Communication   Communication: HOH  Cognition Arousal/Alertness: Awake/alert Behavior During Therapy: WFL for tasks assessed/performed Overall Cognitive Status: Within Functional Limits for tasks assessed                                 General Comments: pt states she is stubborn and has been mobilizing without AD since her fall, recommended pt use her rollator, declined need for RW        General Comments General comments (skin integrity, edema, etc.): Educated about using RW instead of rollator for increased safety, however, pt refusing    Exercises     Assessment/Plan    PT Assessment Patient needs continued PT services  PT Problem List Decreased strength;Decreased activity tolerance;Decreased balance;Decreased mobility;Decreased knowledge of use of DME;Decreased knowledge of precautions;Decreased safety awareness       PT Treatment Interventions DME instruction;Gait training;Stair training;Functional mobility training;Therapeutic exercise;Therapeutic activities;Balance training;Patient/family education    PT Goals (Current goals can be found in the Care Plan section)  Acute Rehab PT Goals Patient Stated Goal: to go home PT Goal Formulation: With patient Time For Goal Achievement: 12/08/21 Potential to Achieve Goals: Good    Frequency Min 5X/week   Barriers to discharge        Co-evaluation PT/OT/SLP Co-Evaluation/Treatment: Yes             AM-PAC PT "6 Clicks" Mobility  Outcome Measure Help  needed turning from your back to your side while in a flat bed without using bedrails?: A Little Help needed moving from lying on your back to sitting on the side of a flat bed without using bedrails?: A Little Help needed moving to and from a bed to a chair (including a wheelchair)?: A Little Help needed standing up from a chair using your arms (e.g., wheelchair or bedside chair)?: A Little Help needed to walk in hospital room?: A Little Help needed climbing 3-5 steps with a railing? : A Lot 6 Click Score: 17    End of Session Equipment Utilized During Treatment: Gait belt Activity Tolerance: Patient limited by pain Patient left: in bed;with call bell/phone within reach (on stretcher on ED) Nurse Communication: Mobility status PT Visit Diagnosis: Other abnormalities of gait and mobility (R26.89);Difficulty in walking, not elsewhere classified (R26.2);Pain Pain - Right/Left: Left Pain - part of body:  (pelvis)    Time: 1610-9604 PT Time Calculation (min) (ACUTE ONLY): 21 min   Charges:   PT Evaluation $PT Eval Moderate Complexity: 1 Mod          Farley Ly, PT, DPT  Acute Rehabilitation Services  Pager: [phone] Office: [phone]   Lehman Prom 11/24/2021, 2:06 PM

## 2021-11-24 NOTE — Discharge Summary (Signed)
Physician Discharge Summary  Joy Higgins:[id number] DOB: 1946-02-10 DOA: 11/23/2021  PCP: Lonie Peak, PA-C  Admit date: 11/23/2021 Discharge date: 11/24/2021  Admitted From: Home Disposition: Home with home health  Recommendations for Outpatient Follow-up:  Follow up with PCP in 1-2 weeks   Home Health: PT/OT Equipment/Devices: None  Discharge Condition: Stable CODE STATUS: Full code Diet recommendation: Regular diet  Discharge summary:  75 year old with no significant medical issues fell at home on her left side striking her 3 days ago.  She was hurting but continue to work at home, clean her house and cook her meals.  Even after 3 days it kept hurting so she thought she should check it out with emergency room.  In the emergency room, hemodynamically stable.  Skeletal survey positive for left superior and inferior pubic rami fractures without evidence of dislocation or complication.  Observed in the ER.  Mobilized with PT OT.  Has some pain on the left hip but able to mobilize safely.    Plan: Safe to discharge.  Has adequate support system at home.  Lives with her brother. Weightbearing as tolerated.  She was already working at home before seeking medical help. Tylenol for mild pain.  Short course of Norco for moderate to severe pain. Will benefit with home safety evaluation/evaluation with therapist at home.  Will order PT OT.  Urinalysis was done in the emergency room, patient with no urinary symptoms.  No indication to treat asymptomatic bacteriuria.   Discharge Diagnoses:  Principal Problem:   Closed fracture of left inferior pubic ramus (HCC) Active Problems:   Left hip pain   Fall at home, initial encounter   Hypokalemia    Discharge Instructions  Discharge Instructions     Call MD for:  severe uncontrolled pain   Complete by: As directed    Diet general   Complete by: As directed    Discharge instructions   Complete by: As directed    Use Tylenol  for mild pain.  You can use prescribed pain medication for moderate to severe pain.   Continue mobilizing.   Increase activity slowly   Complete by: As directed       Allergies as of 11/24/2021   No Known Allergies      Medication List     STOP taking these medications    aspirin EC 81 MG tablet       TAKE these medications    acetaminophen 500 MG tablet Commonly known as: TYLENOL Take 1 tablet (500 mg total) by mouth every 6 (six) hours as needed for mild pain (or Fever >/= 101).   HYDROcodone-acetaminophen 5-325 MG tablet Commonly known as: NORCO/VICODIN Take 1 tablet by mouth every 6 (six) hours as needed for up to 3 days for moderate pain.   memantine 10 MG tablet Commonly known as: NAMENDA Take 10 mg by mouth 2 (two) times daily.        Follow-up Information     Lonie Peak, PA-C Follow up in 2 week(s).   Specialty: Physician Assistant Contact information: [address] [place] [address] [phone]                No Known Allergies  Consultations: None   Procedures/Studies: DG Lumbar Spine Complete  Result Date: 11/23/2021 CLINICAL DATA:  Back and pelvic pain, initial encounter EXAM: LUMBAR SPINE - COMPLETE 4+ VIEW COMPARISON:  None. FINDINGS: Five lumbar type vertebral bodies are well visualized. Vertebral body height is well maintained. Facet  hypertrophic changes are noted in the lower lumbar spine. No pars defects are seen. Mild osteophytic changes are noted. No abnormal mass or abnormal calcifications are seen. Mild scoliosis concave to the left is noted. IMPRESSION: Mild degenerative change without acute abnormality. Electronically Signed   By: Alcide Clever M.D.   On: 11/23/2021 17:50   DG Hip Unilat W or Wo Pelvis 2-3 Views Left  Result Date: 11/23/2021 CLINICAL DATA:  Back and pelvic pain, initial encounter EXAM: DG HIP (WITH OR WITHOUT PELVIS) 3V LEFT COMPARISON:  05/23/2018 FINDINGS: Healed superior and inferior pubic ramus  fractures are noted on the right. New superior and inferior pubic rami fractures are noted on the left. Pelvic ring is otherwise intact. Proximal left femur appears within normal limits. No dislocation is seen. No soft tissue abnormality is noted. IMPRESSION: Left superior and inferior pubic ramus fractures. Electronically Signed   By: Alcide Clever M.D.   On: 11/23/2021 17:51   DG Hip Unilat W or Wo Pelvis 2-3 Views Right  Result Date: 11/23/2021 CLINICAL DATA:  Pelvic and hip pain, initial encounter EXAM: DG HIP (WITH OR WITHOUT PELVIS) 2V RIGHT COMPARISON:  05/23/2018 FINDINGS: Previously seen superior and inferior pubic ramus fractures on the right now show interval healing. No acute fracture or dislocation in the proximal right femur is noted. Left pubic rami fractures are noted similar to that seen on the left hip film. IMPRESSION: Healed right superior and inferior pubic rami fractures. Known left pubic rami fractures. Electronically Signed   By: Alcide Clever M.D.   On: 11/23/2021 17:52   (Echo, Carotid, EGD, Colonoscopy, ERCP)    Subjective: Patient seen and examined.  Pleasant.  No issues overnight.  She tells me that she can go home.  She just wanted to know what is wrong with her hip.   Discharge Exam: Vitals:   11/24/21 0954 11/24/21 1000  BP: (!) 124/91 (!) 143/103  Pulse: 77 86  Resp: 19 14  Temp:    SpO2: 93% 92%   Vitals:   11/24/21 0653 11/24/21 0800 11/24/21 0954 11/24/21 1000  BP: (!) 150/102 (!) 148/99 (!) 124/91 (!) 143/103  Pulse: 79 82 77 86  Resp: Temp: 98.1 F (36.7 C)     TempSrc: Oral     SpO2: 96% 94% 93% 92%    General: Pt is alert, awake, not in acute distress Pleasant.  Comfortable at rest.   Cardiovascular: RRR, S1/S2 +, no rubs, no gallops Respiratory: CTA bilaterally, no wheezing, no rhonchi Abdominal: Soft, NT, ND, bowel sounds + Extremities: no edema, no cyanosis    The results of significant diagnostics from this hospitalization  (including imaging, microbiology, ancillary and laboratory) are listed below for reference.     Microbiology: Recent Results (from the past 240 hour(s))  Resp Panel by RT-PCR (Flu A&B, Covid) Nasopharyngeal Swab     Status: None   Collection Time: 11/23/21  6:40 PM   Specimen: Nasopharyngeal Swab; Nasopharyngeal(NP) swabs in vial transport medium  Result Value Ref Range Status   SARS Coronavirus 2 by RT PCR NEGATIVE NEGATIVE Final    Comment: (NOTE) SARS-CoV-2 target nucleic acids are NOT DETECTED.  The SARS-CoV-2 RNA is generally detectable in upper respiratory specimens during the acute phase of infection. The lowest concentration of SARS-CoV-2 viral copies this assay can detect is 138 copies/mL. A negative result does not preclude SARS-Cov-2 infection and should not be used as the sole basis for treatment or other patient  management decisions. A negative result may occur with  improper specimen collection/handling, submission of specimen other than nasopharyngeal swab, presence of viral mutation(s) within the areas targeted by this assay, and inadequate number of viral copies(<138 copies/mL). A negative result must be combined with clinical observations, patient history, and epidemiological information. The expected result is Negative.  Fact Sheet for Patients:  BloggerCourse.com  Fact Sheet for Healthcare Providers:  SeriousBroker.it  This test is no t yet approved or cleared by the [place] FDA and  has been authorized for detection and/or diagnosis of SARS-CoV-2 by FDA under an Emergency Use Authorization (EUA). This EUA will remain  in effect (meaning this test can be used) for the duration of the COVID-19 declaration under Section 564(b)(1) of the Act, 21 U.S.C.section 360bbb-3(b)(1), unless the authorization is terminated  or revoked sooner.       Influenza A by PCR NEGATIVE NEGATIVE Final   Influenza B by PCR  NEGATIVE NEGATIVE Final    Comment: (NOTE) The Xpert Xpress SARS-CoV-2/FLU/RSV plus assay is intended as an aid in the diagnosis of influenza from Nasopharyngeal swab specimens and should not be used as a sole basis for treatment. Nasal washings and aspirates are unacceptable for Xpert Xpress SARS-CoV-2/FLU/RSV testing.  Fact Sheet for Patients: BloggerCourse.com  Fact Sheet for Healthcare Providers: SeriousBroker.it  This test is not yet approved or cleared by the [place] FDA and has been authorized for detection and/or diagnosis of SARS-CoV-2 by FDA under an Emergency Use Authorization (EUA). This EUA will remain in effect (meaning this test can be used) for the duration of the COVID-19 declaration under Section 564(b)(1) of the Act, 21 U.S.C. section 360bbb-3(b)(1), unless the authorization is terminated or revoked.  Performed at [facility], [address], [place], [place] [address]      Labs: BNP (last 3 results) No results for input(s): BNP in the last 8760 hours. Basic Metabolic Panel: Recent Labs  Lab 11/23/21 1846 11/24/21 0528  NA 131* 133*  K 3.2* 3.8  CL 94* 101  CO2 27 25  GLUCOSE 99 76  BUN 5* 7*  CREATININE 0.82 0.81  CALCIUM 8.6* 8.5*  MG  --  1.9   Liver Function Tests: Recent Labs  Lab 11/24/21 0528  AST 18  ALT 8  ALKPHOS 118  BILITOT 0.9  PROT 6.5  ALBUMIN 2.7*   No results for input(s): LIPASE, AMYLASE in the last 168 hours. No results for input(s): AMMONIA in the last 168 hours. CBC: Recent Labs  Lab 11/23/21 1846 11/24/21 0528  WBC 7.3 6.1  NEUTROABS 4.3  --   HGB 12.4 12.4  HCT 37.2 38.0  MCV 99.5 101.9*  PLT 146* 135*   Cardiac Enzymes: No results for input(s): CKTOTAL, CKMB, CKMBINDEX, TROPONINI in the last 168 hours. BNP: Invalid input(s): POCBNP CBG: No results for input(s): GLUCAP in the last 168 hours. D-Dimer No results for input(s): DDIMER in the  last 72 hours. Hgb A1c No results for input(s): HGBA1C in the last 72 hours. Lipid Profile No results for input(s): CHOL, HDL, LDLCALC, TRIG, CHOLHDL, LDLDIRECT in the last 72 hours. Thyroid function studies No results for input(s): TSH, T4TOTAL, T3FREE, THYROIDAB in the last 72 hours.  Invalid input(s): FREET3 Anemia work up No results for input(s): VITAMINB12, FOLATE, FERRITIN, TIBC, IRON, RETICCTPCT in the last 72 hours. Urinalysis    Component Value Date/Time   COLORURINE YELLOW 11/23/2021 1410   APPEARANCEUR CLEAR 11/23/2021 1410   LABSPEC <1.005 (L) 11/23/2021  1410   PHURINE 5.5 11/23/2021 1410   GLUCOSEU NEGATIVE 11/23/2021 1410   HGBUR TRACE (A) 11/23/2021 1410   BILIRUBINUR NEGATIVE 11/23/2021 1410   KETONESUR NEGATIVE 11/23/2021 1410   PROTEINUR NEGATIVE 11/23/2021 1410   NITRITE POSITIVE (A) 11/23/2021 1410   LEUKOCYTESUR LARGE (A) 11/23/2021 1410   Sepsis Labs Invalid input(s): PROCALCITONIN,  WBC,  LACTICIDVEN Microbiology Recent Results (from the past 240 hour(s))  Resp Panel by RT-PCR (Flu A&B, Covid) Nasopharyngeal Swab     Status: None   Collection Time: 11/23/21  6:40 PM   Specimen: Nasopharyngeal Swab; Nasopharyngeal(NP) swabs in vial transport medium  Result Value Ref Range Status   SARS Coronavirus 2 by RT PCR NEGATIVE NEGATIVE Final    Comment: (NOTE) SARS-CoV-2 target nucleic acids are NOT DETECTED.  The SARS-CoV-2 RNA is generally detectable in upper respiratory specimens during the acute phase of infection. The lowest concentration of SARS-CoV-2 viral copies this assay can detect is 138 copies/mL. A negative result does not preclude SARS-Cov-2 infection and should not be used as the sole basis for treatment or other patient management decisions. A negative result may occur with  improper specimen collection/handling, submission of specimen other than nasopharyngeal swab, presence of viral mutation(s) within the areas targeted by this assay, and  inadequate number of viral copies(<138 copies/mL). A negative result must be combined with clinical observations, patient history, and epidemiological information. The expected result is Negative.  Fact Sheet for Patients:  BloggerCourse.com  Fact Sheet for Healthcare Providers:  SeriousBroker.it  This test is no t yet approved or cleared by the [place] FDA and  has been authorized for detection and/or diagnosis of SARS-CoV-2 by FDA under an Emergency Use Authorization (EUA). This EUA will remain  in effect (meaning this test can be used) for the duration of the COVID-19 declaration under Section 564(b)(1) of the Act, 21 U.S.C.section 360bbb-3(b)(1), unless the authorization is terminated  or revoked sooner.       Influenza A by PCR NEGATIVE NEGATIVE Final   Influenza B by PCR NEGATIVE NEGATIVE Final    Comment: (NOTE) The Xpert Xpress SARS-CoV-2/FLU/RSV plus assay is intended as an aid in the diagnosis of influenza from Nasopharyngeal swab specimens and should not be used as a sole basis for treatment. Nasal washings and aspirates are unacceptable for Xpert Xpress SARS-CoV-2/FLU/RSV testing.  Fact Sheet for Patients: BloggerCourse.com  Fact Sheet for Healthcare Providers: SeriousBroker.it  This test is not yet approved or cleared by the [place] FDA and has been authorized for detection and/or diagnosis of SARS-CoV-2 by FDA under an Emergency Use Authorization (EUA). This EUA will remain in effect (meaning this test can be used) for the duration of the COVID-19 declaration under Section 564(b)(1) of the Act, 21 U.S.C. section 360bbb-3(b)(1), unless the authorization is terminated or revoked.  Performed at [facility], [address], [place], [place] [address]      Time coordinating discharge:  28 minutes  SIGNED:   Dorcas Carrow, MD  [facility] 11/24/2021, 1:36 PM

## 2021-11-24 NOTE — TOC CAGE-AID Note (Signed)
Transition of Care [facility]) - CAGE-AID Screening   Patient Details  Name: Joy Higgins MRN: [id number] Date of Birth: 1946/07/16  [facility]) CM/SW Contact:    Patrece Tallie C Tarpley-Carter, LCSWA Phone Number: 11/24/2021, 11:19 AM   Clinical Narrative: Pt participated in Cage-Aid.  Pt stated she does not use substance or ETOH (socially).  Pt was not offered resources, due to no usage of substance or ETOH.  Larua Collier Tarpley-Carter, MSW, LCSW-A Pronouns:  She/Her/Hers [facility]itions of Care Clinical Social Worker Direct Number:  [phone] [email or web address]      CAGE-AID Screening:    Have You Ever Felt You Ought to Cut Down on Your Drinking or Drug Use?: No Have People Annoyed You By [employer] Your Drinking Or Drug Use?: No Have You Felt Bad Or Guilty About Your Drinking Or Drug Use?: No Have You Ever Had a Drink or Used Drugs First Thing In The Morning to Steady Your Nerves or to Get Rid of a Hangover?: No CAGE-AID Score: 0  Substance Abuse Education Offered: No (Pt drinks ETOH socially.)

## 2021-11-24 NOTE — Evaluation (Signed)
Occupational Therapy Evaluation Patient Details Name: Joy Higgins MRN: [id number] DOB: 07/22/1946 Today's Date: 11/24/2021   History of Present Illness Pt is a 75 year old woman admitted 11/23/21 with worsening L hip pain after ground level fall. Imaging +acute L superior and inferior pubic rami fx. PMH: GERD, arthritis, HLD, osteoporosis.   Clinical Impression   Pt lives with her brother and occasionally uses either a cane or walker as needed. She is independent in ADL and IADL and describes herself as stubborn. Pt's other brother provides transportation. Pt presents with L LE pain and poor balance. She requires min assist to stand and min guard assist with RW for short distance ambulation. She is needs mod assist for LB dressing. Pt states her brothers will help her with whatever she needs and was functioning at home for 3 days since her fall. Pt is agreeable to [facility] therapies. Will follow acutely.    Recommendations for follow up therapy are one component of a multi-disciplinary discharge planning process, led by the attending physician.  Recommendations may be updated based on patient status, additional functional criteria and insurance authorization.   Follow Up Recommendations  Home health OT    Assistance Recommended at Discharge Intermittent Supervision/Assistance  Functional Status Assessment  Patient has had a recent decline in their functional status and demonstrates the ability to make significant improvements in function in a reasonable and predictable amount of time.  Equipment Recommendations  None recommended by OT    Recommendations for Other Services       Precautions / Restrictions Precautions Precautions: Fall Restrictions Weight Bearing Restrictions: No RLE Weight Bearing: Weight bearing as tolerated LLE Weight Bearing: Weight bearing as tolerated      Mobility Bed Mobility Overal bed mobility: Needs Assistance Bed Mobility: Supine to Sit;Sit to Supine      Supine to sit: Min assist Sit to supine: Min assist   General bed mobility comments: assist for L LE in and out of bed    Transfers Overall transfer level: Needs assistance Equipment used: Rolling walker (2 wheels) Transfers: Sit to/from Stand Sit to Stand: Min assist           General transfer comment: cues for hand placement, assist to rise and steady      Balance Overall balance assessment: Needs assistance   Sitting balance-Leahy Scale: Good     Standing balance support: Bilateral upper extremity supported Standing balance-Leahy Scale: Poor                             ADL either performed or assessed with clinical judgement   ADL Overall ADL's : Needs assistance/impaired Eating/Feeding: Independent;Bed level   Grooming: Set up;Sitting   Upper Body Bathing: Set up;Sitting   Lower Body Bathing: Minimal assistance;Sit to/from stand   Upper Body Dressing : Set up;Sitting   Lower Body Dressing: Sit to/from stand;Moderate assistance   Toilet Transfer: Min guard;Ambulation;Rolling walker (2 wheels)   Toileting- Clothing Manipulation and Hygiene: Min guard;Sit to/from stand       Functional mobility during ADLs: Min guard;Rolling walker (2 wheels) General ADL Comments: assist needed to don and doff L sock, states she can rely on her brother to assist     Vision Baseline Vision/History: 1 Wears glasses Ability to See in Adequate Light: 0 Adequate Patient Visual Report: No change from baseline       Perception     Praxis  Pertinent Vitals/Pain Pain Assessment: 0-10 Pain Score: 10-Worst pain ever Pain Location: pelvis Pain Descriptors / Indicators: Grimacing;Guarding;Discomfort Pain Intervention(s): Repositioned;Monitored during session;Limited activity within patient's tolerance     Hand Dominance Right   Extremity/Trunk Assessment Upper Extremity Assessment Upper Extremity Assessment: Overall WFL for tasks assessed   Lower  Extremity Assessment Lower Extremity Assessment: Defer to PT evaluation       Communication Communication Communication: HOH   Cognition Arousal/Alertness: Awake/alert Behavior During Therapy: WFL for tasks assessed/performed Overall Cognitive Status: Within Functional Limits for tasks assessed                                 General Comments: pt states she is stubborn and has been mobilizing without AD since her fall, recommended pt use her rollator, declined need for RW     General Comments       Exercises     Shoulder Instructions      Home Living Family/patient expects to be discharged to:: Private residence Living Arrangements: Other relatives (brother) Available Help at Discharge: Family;Available 24 hours/day Type of Home: Mobile home Home Access: Stairs to enter Entrance Stairs-Number of Steps: 4 Entrance Stairs-Rails: Right Home Layout: One level     Bathroom Shower/Tub: Producer, television/film/video: Standard     Home Equipment: BSC/3in1;Cane - single point;Rollator (4 wheels);Shower seat;Grab bars - tub/shower;Hand held shower head          Prior Functioning/Environment Prior Level of Function : Independent/Modified Independent             Mobility Comments: OCcasionally uses cane vs rollator ADLs Comments: Independent with ADL and IADLs, does not drive        OT Problem List: Decreased strength;Decreased activity tolerance;Impaired balance (sitting and/or standing);Decreased knowledge of use of DME or AE;Pain      OT Treatment/Interventions: Self-care/ADL training;DME and/or AE instruction;Therapeutic activities;Patient/family education;Balance training    OT Goals(Current goals can be found in the care plan section) Acute Rehab OT Goals OT Goal Formulation: With patient Time For Goal Achievement: 12/08/21 Potential to Achieve Goals: Good ADL Goals Pt Will Perform Grooming: with supervision;standing Pt Will Perform  Lower Body Bathing: with supervision;sit to/from stand;with adaptive equipment Pt Will Perform Lower Body Dressing: with supervision;with adaptive equipment;sit to/from stand Pt Will Transfer to Toilet: with supervision;ambulating Pt Will Perform Toileting - Clothing Manipulation and hygiene: with supervision;sitting/lateral leans  OT Frequency: Min 2X/week   Barriers to D/C:            Co-evaluation              AM-PAC OT "6 Clicks" Daily Activity     Outcome Measure Help from another person eating meals?: None Help from another person taking care of personal grooming?: A Little Help from another person toileting, which includes using toliet, bedpan, or urinal?: A Little Help from another person bathing (including washing, rinsing, drying)?: A Little Help from another person to put on and taking off regular upper body clothing?: A Little Help from another person to put on and taking off regular lower body clothing?: A Lot 6 Click Score: 18   End of Session Equipment Utilized During Treatment: Rolling walker (2 wheels);Gait belt  Activity Tolerance: Patient limited by pain Patient left: in bed;with call bell/phone within reach  OT Visit Diagnosis: Unsteadiness on feet (R26.81);Other abnormalities of gait and mobility (R26.89);Pain;History of falling (Z91.81);Muscle weakness (generalized) (M62.81)  Time: 0355-9741 OT Time Calculation (min): 20 min Charges:  OT General Charges $OT Visit: 1 Visit OT Evaluation $OT Eval Moderate Complexity: 1 Mod Martie Round, OTR/L Acute Rehabilitation Services Pager: [phone] Office: [phone]   Evern Bio 11/24/2021, 1:04 PM

## 2021-11-24 NOTE — ED Notes (Signed)
Patient pending discharge. Patient states she has a ride coming within the hour.

## 2022-01-24 DEATH — deceased

## 2022-05-25 IMAGING — DX DG LUMBAR SPINE COMPLETE 4+V
5 series · 5 of 5 positions shown · non-contrast
Comparison: None.

CLINICAL DATA: Back and pelvic pain, initial encounter

EXAM:
LUMBAR SPINE - COMPLETE 4+ VIEW

[l-spine ap]
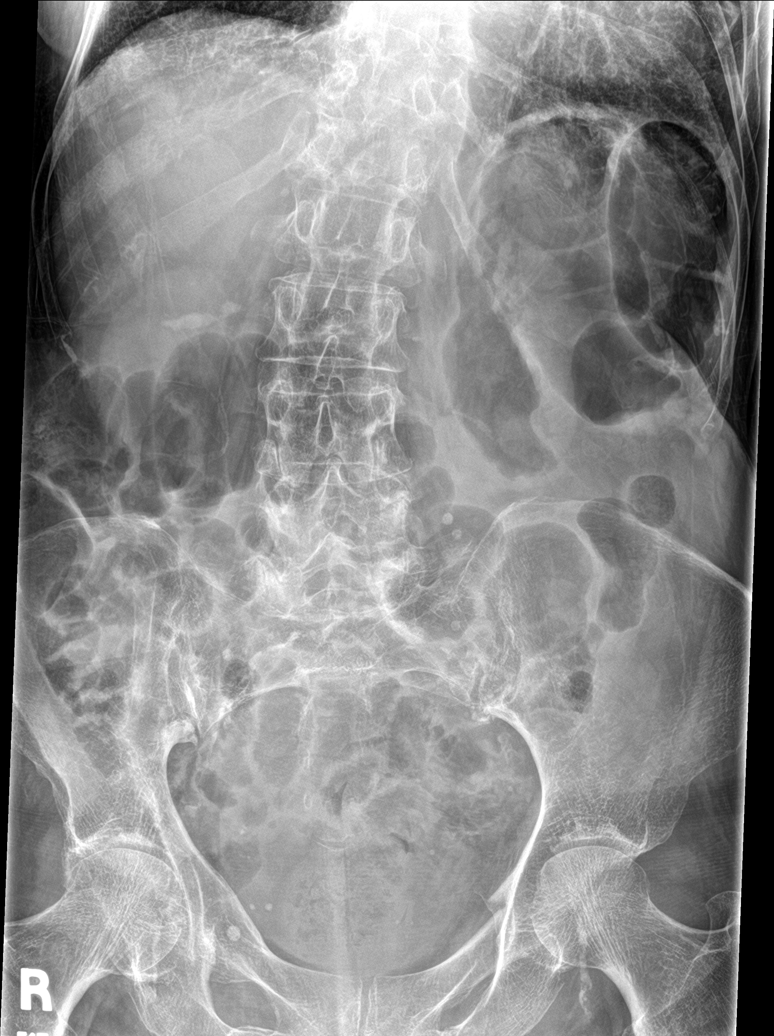

[l-spine obl (1 of 2)]
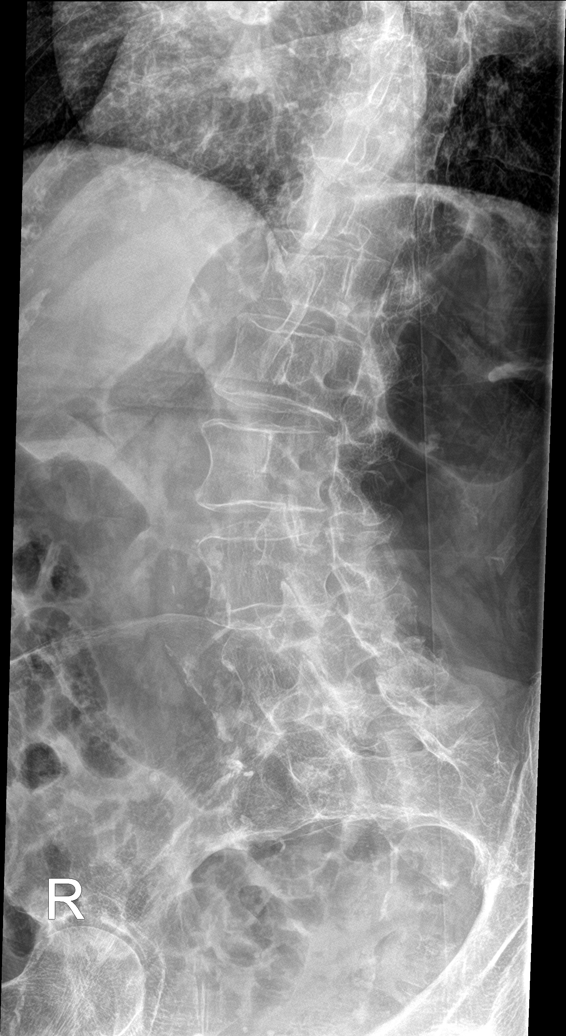

[l-spine obl (2 of 2)]
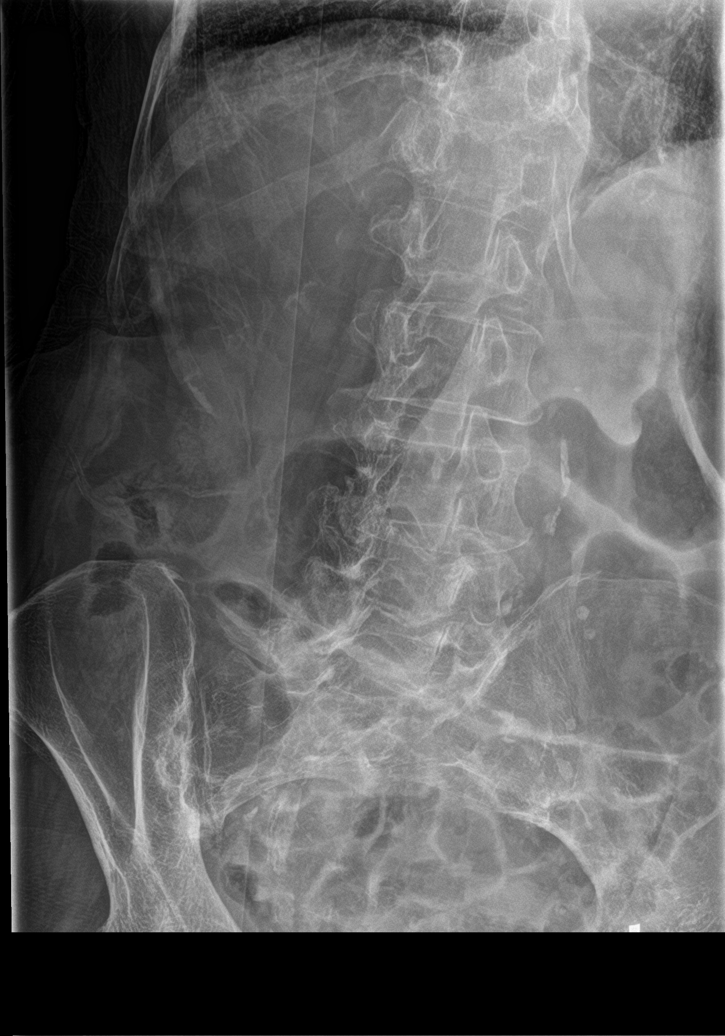

[l-spine lat]
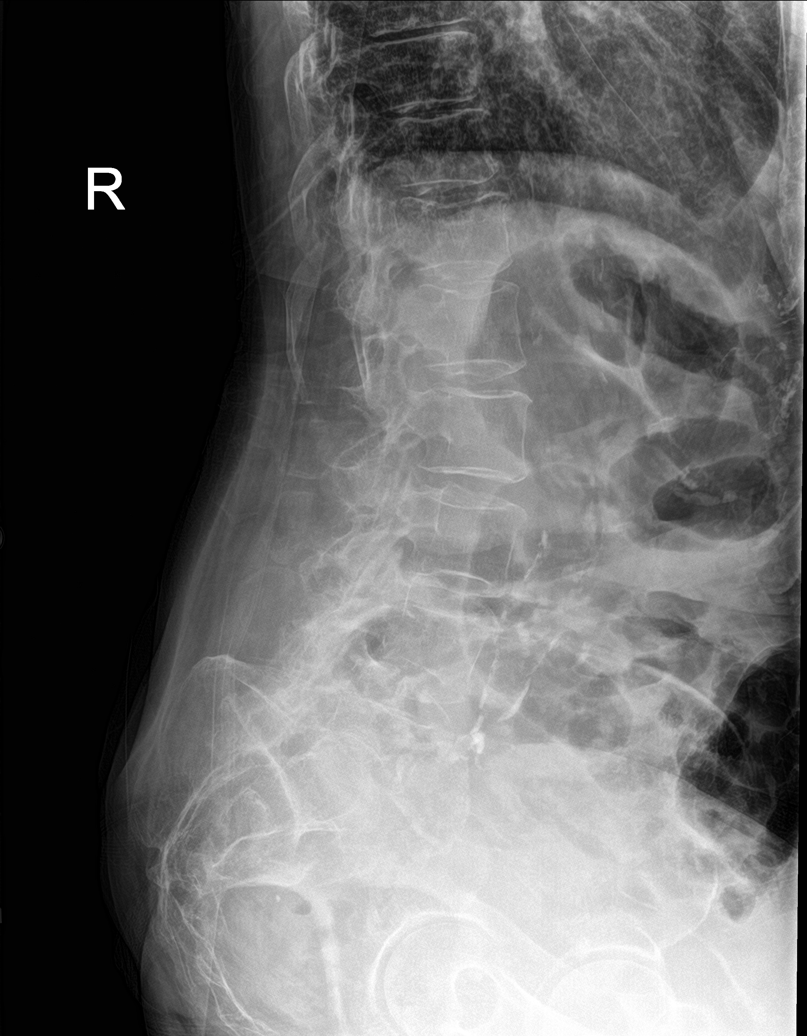

[l-spine spot]
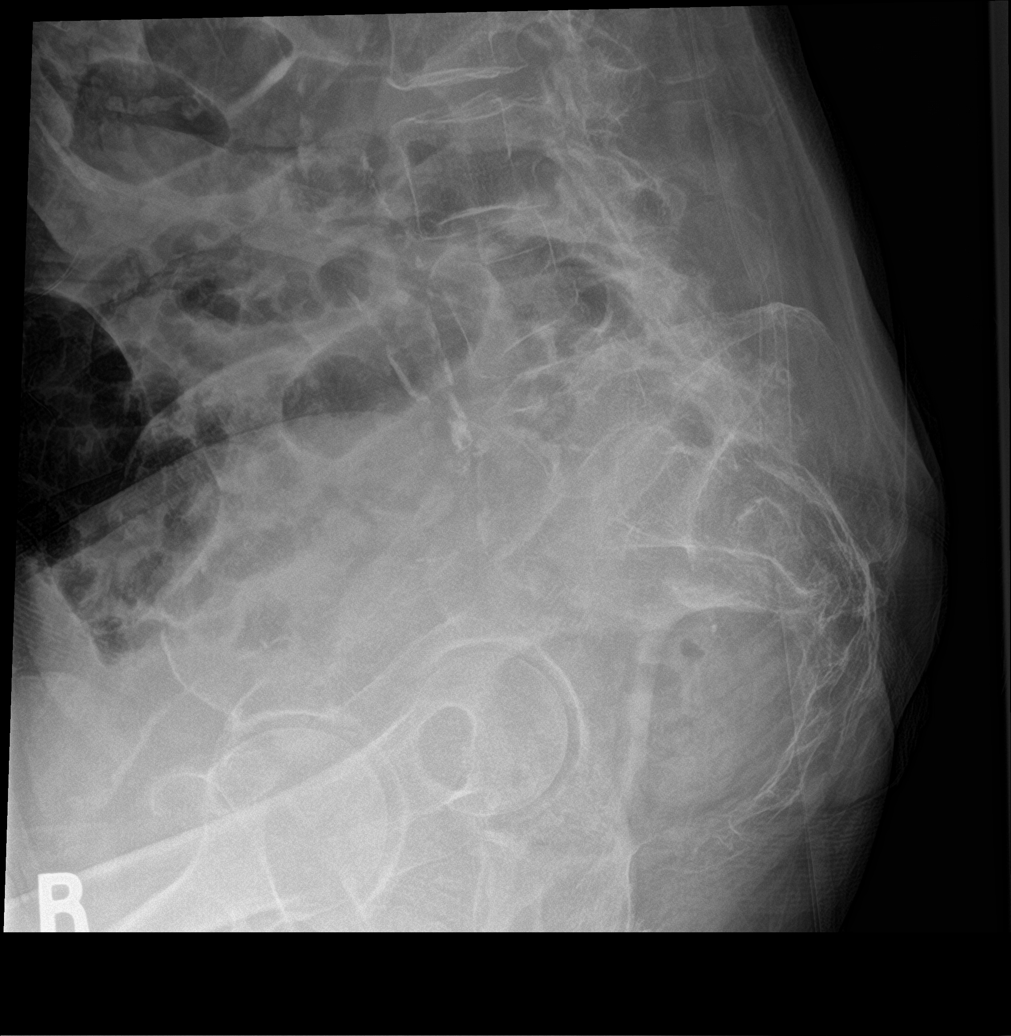

[5 of 5 positions shown; findings below may reference images not displayed]

FINDINGS: Five lumbar type vertebral bodies are well visualized. Vertebral
body height is well maintained. Facet hypertrophic changes are noted
in the lower lumbar spine. No pars defects are seen. Mild
osteophytic changes are noted. No abnormal mass or abnormal
calcifications are seen. Mild scoliosis concave to the left is
noted.
IMPRESSION: Mild degenerative change without acute abnormality.
